# Patient Record
Sex: Female | Born: 1960 | Race: White | Hispanic: No | Marital: Married | Smoking: Never smoker
Health system: Southern US, Community
[De-identification: ages and names within clinical notes are randomized; demographics above are authoritative.]

## PROBLEM LIST (undated history)

## (undated) DIAGNOSIS — K635 Polyp of colon: Secondary | ICD-10-CM

## (undated) DIAGNOSIS — K219 Gastro-esophageal reflux disease without esophagitis: Secondary | ICD-10-CM

## (undated) DIAGNOSIS — Z8619 Personal history of other infectious and parasitic diseases: Secondary | ICD-10-CM

## (undated) DIAGNOSIS — D649 Anemia, unspecified: Secondary | ICD-10-CM

## (undated) DIAGNOSIS — R109 Unspecified abdominal pain: Secondary | ICD-10-CM

## (undated) HISTORY — PX: CATARACT EXTRACTION: SUR2

## (undated) HISTORY — DX: Gastro-esophageal reflux disease without esophagitis: K21.9

## (undated) HISTORY — DX: Personal history of other infectious and parasitic diseases: Z86.19

## (undated) HISTORY — DX: Polyp of colon: K63.5

## (undated) HISTORY — DX: Anemia, unspecified: D64.9

---

## 2004-04-01 HISTORY — PX: ABDOMINAL HYSTERECTOMY: SHX81

## 2004-04-01 LAB — HM PAP SMEAR: HM Pap smear: NORMAL

## 2005-01-03 ENCOUNTER — Encounter (INDEPENDENT_AMBULATORY_CARE_PROVIDER_SITE_OTHER): Payer: Self-pay | Admitting: Specialist

## 2005-01-03 ENCOUNTER — Observation Stay (HOSPITAL_COMMUNITY): Admission: RE | Admit: 2005-01-03 | Discharge: 2005-01-04 | Payer: Self-pay | Admitting: *Deleted

## 2008-10-11 ENCOUNTER — Encounter: Admission: RE | Admit: 2008-10-11 | Discharge: 2008-10-11 | Payer: Self-pay | Admitting: Obstetrics and Gynecology

## 2010-08-17 NOTE — Op Note (Signed)
Helen Harrison, GUARINO NO.:  [id number]   MEDICAL RECORD NO.:  [id number]          PATIENT TYPE:  OBV   LOCATION:  9399                          FACILITY:  [facility]   PHYSICIAN:  Dunlap B. Earlene Plater, M.D.  DATE OF BIRTH:  07/31/1960   DATE OF PROCEDURE:  01/03/2005  DATE OF DISCHARGE:                                 OPERATIVE REPORT   PREOPERATIVE DIAGNOSIS:  Abnormal bleeding, uterine fibroids, left  hydrosalpinx.   POSTOPERATIVE DIAGNOSIS:  Abnormal bleeding, uterine fibroids, left  hydrosalpinx.  Endometriosis.   PROCEDURE:  LAVH, left salpingectomy.   SURGEON:  Chester Holstein. Earlene Plater, M.D.   ASSISTANT:  Cordelia Pen A. Rosalio Macadamia, M.D.   ANESTHESIA:  General.   SPECIMENS:  Uterus, cervix, and left tube.   ESTIMATED BLOOD LOSS:  300 mL.   COMPLICATIONS:  None.   FINDINGS:  Fibroid uterus, normal appearing ovaries, but superficial minimal  endometriosis involving both ovaries.  Left hydrosalpinx apparently due to  adhesions from endometriosis.  Tube was adherent to the ovary and sigmoid  colon.  Also scattered endometriosis implants of the bladder flap and  appendix.   INDICATIONS FOR PROCEDURE:  The patient with a history of heavy and  irregular menstrual bleeding without any history of pelvic pain.  Has  complained of about 3 months of dysmenorrhea.  She was noted to have  fibroids on ultrasound, one of which was submucosal on saline infusion,  however, intramural fibroids were also noted as was a left hydrosalpinx.  The patient was advised of the risks of surgery including infection,  bleeding, damage to bowel, bladder, and surrounding organs.   DESCRIPTION OF PROCEDURE:  The patient was taken to the operating room and  after general anesthesia was obtained, she was placed in the Alan stirrups  and prepped and draped in the usual sterile fashion.  Foley catheter was  inserted into the bladder.  Sponge stick was placed in the vagina.   A 10 mm incision was placed in  the umbilicus and carried sharply to the  fascia.  The fascia was divided sharply and elevated with Kocher clamps.  Posterior sheath and peritoneum were elevated and entered sharply.  Pursestring suture of 0 Vicryl was placed around the fascial defect.  Hasson  cannula inserted and secured. Pneumoperitoneum obtained with CO2 gas.  Trendelenburg position obtained.  11 mm XL trocar placed in the left lower  quadrant and 5 mm in the right lower quadrant each under direct laparoscopic  visualization.  Course of each ureter identified and found to be well away  from the area of interest.  Left tube was identified.  Adhesions to the  sigmoid colon were taken down sharply.  Adhesions to the ovary were taken  down with the Harmonic scalpel.  Dissection was continued up to the uterine  cornua and the tube was transected, removed, and submitted to pathology.   The left round ligament was then identified, sealed, and divided with the  Harmonic.  Bladder flap developed with the Harmonic.  Anterior and posterior  peritoneum developed with the Harmonic.  Uterine artery  exposed.  It was  grasped with the Harmonic and divided at the level of the isthmus on the  uterus.  The procedure was repeated on the right side in the exact same  fashion.  The bladder was mobilized bluntly.  Sponge stick placed into the  anterior fornix and anterior colpotomy made with the Harmonic scalpel.   Gas was released and attention turned to the vagina.  Jacobs tenaculum  placed on the anterior and posterior lip of the cervix.  Posterior colpotomy  made sharply.  Uterosacral ligaments grasped with curved Heaney clamp,  divided, and suture ligated with 0 Vicryl with hemostasis obtained.  The  remainder of the cardinal ligaments were clamped with Heaney clamp, divided  sharply, and sutured with 0 Vicryl with hemostasis obtained.  The uterus and  cervix were delivered as an intact specimen.   There was a bleeder on the left  pelvic side wall next to the uterosacral  ligament which was made hemostatic with a superficial simple stitch of 0  Vicryl.   Posterior culdoplasty performed by entering the vagina posteriorly, taking a  bite of the left uterosacral ligament, reefing along the posterior  peritoneum, taking a bite of the right uterosacral ligament, and exiting  posteriorly in the vagina.  The vaginal cuff was then closed with  interrupted figure-of-eight sutures of 0 Vicryl.  The culdoplasty suture was  then tied down.  This obliterated the posterior cul-de-sac and provided good  support to the vaginal cuff.   Pneumoperitoneum reobtained and the pelvis inspected.  There was one  superficial bleeder on the uterosacral ligament stump, made hemostatic with  the bipolar cautery.  The remainder of the lines of dissection were  hemostatic.  The majority of the bladder flap endometriosis was excised with  the specimen.  The residual endometriosis was minimal.   The inferior ports were removed and their sites inspected laparoscopically  and found to be hemostatic.  Scope removed.  Gas released.  Hasson cannula  removed.  Inserted index finger through the fascial defect, snugged down the  pursestring suture.  This obliterated the fascial defect and no intra-  abdominal contents herniated through prior to closure.  Skin was closed at  the umbilicus with 4-0 Vicryl.  Skin was closed at the inferior ports with  Dermabond.   The patient tolerated the procedure well and there were no complications.  She was taken to the recovery room awake, alert, and in stable condition.  Needle, sponge, and instrument counts correct per the operating room staff.      Gerri Spore B. Earlene Plater, M.D.  Electronically Signed     WBD/MEDQ  D:  01/03/2005  T:  01/03/2005  Job:  [id number]

## 2011-01-07 DIAGNOSIS — R12 Heartburn: Secondary | ICD-10-CM | POA: Insufficient documentation

## 2011-01-07 DIAGNOSIS — L709 Acne, unspecified: Secondary | ICD-10-CM | POA: Insufficient documentation

## 2011-01-28 DIAGNOSIS — A048 Other specified bacterial intestinal infections: Secondary | ICD-10-CM | POA: Insufficient documentation

## 2011-01-28 DIAGNOSIS — K219 Gastro-esophageal reflux disease without esophagitis: Secondary | ICD-10-CM | POA: Insufficient documentation

## 2013-04-01 LAB — HM COLONOSCOPY: HM COLON: NORMAL

## 2013-09-29 LAB — HM MAMMOGRAPHY: HM MAMMO: NORMAL

## 2014-03-01 ENCOUNTER — Ambulatory Visit: Payer: Self-pay | Admitting: Ophthalmology

## 2014-03-15 ENCOUNTER — Ambulatory Visit: Payer: Self-pay | Admitting: Ophthalmology

## 2014-05-31 LAB — HM DEXA SCAN: HM DEXA SCAN: NORMAL

## 2014-07-23 NOTE — Op Note (Signed)
PATIENT NAME:  Helen Harrison, [name] MR#:  [id number] DATE OF BIRTH:  Mar 04, 1961  DATE OF PROCEDURE:  03/15/2014  PREOPERATIVE DIAGNOSIS:  Nuclear sclerotic cataract of the right eye.   POSTOPERATIVE DIAGNOSIS:  Nuclear sclerotic cataract of the right eye.   OPERATIVE PROCEDURE:  Cataract extraction by phacoemulsification with implant of intraocular lens to right eye.   SURGEON:  Galen ManilaWilliam Chelci Wintermute, MD.   ANESTHESIA:  1. Managed anesthesia care.  2. Topical tetracaine drops followed by 2% Xylocaine jelly applied in the preoperative holding area.   COMPLICATIONS:  None.   TECHNIQUE:   Stop and chop.  DESCRIPTION OF PROCEDURE:  The patient was examined and consented in the preoperative holding area where the aforementioned topical anesthesia was applied to the right eye and then brought back to the Operating Room where the right eye was prepped and draped in the usual sterile ophthalmic fashion and a lid speculum was placed. A paracentesis was created with the side port blade and the anterior chamber was filled with viscoelastic. A near clear corneal incision was performed with the steel keratome. A continuous curvilinear capsulorrhexis was performed with a cystotome followed by the capsulorrhexis forceps. Hydrodissection and hydrodelineation were carried out with BSS on a blunt cannula. The lens was removed in a stop and chop technique and the remaining cortical material was removed with the irrigation-aspiration handpiece. The capsular bag was inflated with viscoelastic and the Tecnis ZCB00 18.0 -diopter lens, serial number [id number] was placed in the capsular bag without complication. The remaining viscoelastic was removed from the eye with the irrigation-aspiration handpiece. The wounds were hydrated. The anterior chamber was flushed with Miostat and the eye was inflated to physiologic pressure. Due to a penicillin allergy, cefuroxime was not placed in the eye; instead a 3:1 dilution of Vigamox was  placed in the anterior chamber. The wounds were found to be water tight. The eye was dressed with Vigamox. The patient was given protective glasses to wear throughout the day and a shield with which to sleep tonight. The patient was also given drops with which to begin a drop regimen today and will follow-up with me in one day.    ____________________________ Jerilee FieldWilliam L. Casady Voshell, MD wlp:jp D: 03/15/2014 21:57:13 ET T: 03/16/2014 08:09:32 ET JOB#: [id number]  cc: Kaesha Kirsch L. Octa Uplinger, MD, <Dictator> Jerilee FieldWILLIAM L Darlinda Bellows MD ELECTRONICALLY SIGNED 03/16/2014 13:56

## 2014-07-23 NOTE — Op Note (Signed)
PATIENT NAME:  Helen Harrison, Aaisha A MR#:  [id number] DATE OF BIRTH:  April 29, 1960  DATE OF PROCEDURE:  03/01/2014  PREOPERATIVE DIAGNOSIS:  Nuclear sclerotic cataract of the left eye.   POSTOPERATIVE DIAGNOSIS:  Nuclear sclerotic cataract of the left eye.   OPERATIVE PROCEDURE:  Cataract extraction by phacoemulsification with implant of intraocular lens to left eye.   SURGEON:  Galen ManilaWilliam Jewelia Bocchino, MD.   ANESTHESIA:  1. Managed anesthesia care.  2. Topical tetracaine drops followed by 2% Xylocaine jelly applied in the preoperative holding area.   COMPLICATIONS:  None.   TECHNIQUE:   Stop and chop.   DESCRIPTION OF PROCEDURE:  The patient was examined and consented in the preoperative holding area where the aforementioned topical anesthesia was applied to the left eye and then brought back to the Operating Room where the left eye was prepped and draped in the usual sterile ophthalmic fashion and a lid speculum was placed. A paracentesis was created with the side port blade and the anterior chamber was filled with viscoelastic. A near clear corneal incision was performed with the steel keratome. A continuous curvilinear capsulorrhexis was performed with a cystotome followed by the capsulorrhexis forceps. Hydrodissection and hydrodelineation were carried out with BSS on a blunt cannula. The lens was removed in a stop and chop technique and the remaining cortical material was removed with the irrigation-aspiration handpiece. The capsular bag was inflated with viscoelastic and the Tecnis ZCB00, 22.0-diopter lens, serial number [id number] was placed in the capsular bag without complication. The remaining viscoelastic was removed from the eye with the irrigation-aspiration handpiece. The wounds were hydrated. The anterior chamber was flushed with Miostat and the eye was inflated to physiologic pressure.   Please note that CEFUROXIME was not placed within the eye due to a PENICILLIN ALLERGY, rather a 3:1 dilution of  Vigamox was placed in the anterior chamber. The wounds were found to be water tight. The eye was dressed with Vigamox. The patient was given protective glasses to wear throughout the day and a shield with which to sleep tonight. The patient was also given drops with which to begin a drop regimen today and will follow-up with me in one day.      ____________________________ Jerilee FieldWilliam L. Elanor Cale, MD wlp:DT D: 03/01/2014 16:17:21 ET T: 03/01/2014 17:32:59 ET JOB#: [id number]  cc: Saige Canton L. Jayde Mcallister, MD, <Dictator> Jerilee FieldWILLIAM L Sanita Estrada MD ELECTRONICALLY SIGNED 03/02/2014 12:16

## 2014-09-06 ENCOUNTER — Telehealth: Payer: Self-pay | Admitting: Family Medicine

## 2014-09-06 ENCOUNTER — Other Ambulatory Visit: Payer: Self-pay | Admitting: Family Medicine

## 2014-09-06 DIAGNOSIS — K2100 Gastro-esophageal reflux disease with esophagitis, without bleeding: Secondary | ICD-10-CM

## 2014-09-06 DIAGNOSIS — R768 Other specified abnormal immunological findings in serum: Secondary | ICD-10-CM

## 2014-09-06 NOTE — Telephone Encounter (Signed)
Reviewed lab results from [facility]. Vitamin B12 was normal. Vitamin D is low. Resume 2000IU vitamin D3 OTC. Will recheck at her physical in August. Also discussed moving up physical as it is closer to provider due date. She will reschedule for earlier to avoid conflicting with maternity leave.

## 2014-10-10 ENCOUNTER — Ambulatory Visit: Payer: Self-pay | Admitting: Gastroenterology

## 2014-11-02 ENCOUNTER — Ambulatory Visit (INDEPENDENT_AMBULATORY_CARE_PROVIDER_SITE_OTHER): Payer: BLUE CROSS/BLUE SHIELD | Admitting: Gastroenterology

## 2014-11-02 ENCOUNTER — Encounter: Payer: Self-pay | Admitting: Gastroenterology

## 2014-11-02 VITALS — BP 131/82 | HR 90 | Temp 98.6°F | Ht 64.0 in | Wt 160.0 lb

## 2014-11-02 DIAGNOSIS — Q400 Congenital hypertrophic pyloric stenosis: Secondary | ICD-10-CM

## 2014-11-02 MED ORDER — BISMUTH SUBSALICYLATE 262 MG/15ML PO SUSP: 30.0000 mL | Freq: Three times a day (TID) | ORAL | Status: DC

## 2014-11-02 MED ORDER — RANITIDINE HCL 150 MG PO CAPS: 150.0000 mg | ORAL_CAPSULE | Freq: Four times a day (QID) | ORAL | Status: DC

## 2014-11-02 MED ORDER — METRONIDAZOLE 250 MG PO TABS: 250.0000 mg | ORAL_TABLET | Freq: Four times a day (QID) | ORAL | Status: DC

## 2014-11-02 MED ORDER — TETRACYCLINE HCL 500 MG PO CAPS: 500.0000 mg | ORAL_CAPSULE | Freq: Four times a day (QID) | ORAL | Status: DC

## 2014-11-02 NOTE — Progress Notes (Signed)
Gastroenterology Consultation  Referring Provider:     Loura Pardon, NP Primary Care Physician:  Filbert Berthold, NP Primary Gastroenterologist:  Dr. Servando Snare     Reason for Consultation:     H. pylori        HPI:   Helen Harrison is a 54 y.o. y/o female referred for consultation & management of H. pylori infection by Dr. Filbert Berthold, NP.  Patient comes today with a history of H. pylori infection. The patient states she has been on multiple medications for her H. pylori. The patient's treatments have been limited by her allergy to amoxicillin. The patient states that she got a terrible migraine headache when she took amoxicillin. There is no report of any unexplained weight loss and the patient actually states she is gaining weight. There is no report of any GI symptoms except that she has some heartburn which she reports has got better when she started taking probiotics. The patient was follow-up in [facility] for her H. pylori in the past revealed she states that after having the treatment every time she did the breath test came back positive. He also states that she's been treated multiple times with clarithromycin for her H. pylori.  Past Medical History  Diagnosis Date  . Acid reflux   . History of Helicobacter pylori infection     Past Surgical History  Procedure Laterality Date  . Abdominal hysterectomy      Prior to Admission medications   Medication Sig Start Date End Date Taking? Authorizing Provider  cholecalciferol (VITAMIN D) 1000 UNITS tablet Take 2,000 Units by mouth daily.   Yes Historical Provider, MD  Fish Oil-Cholecalciferol (FISH OIL + D3) 1000-1000 MG-UNIT CAPS Take 1,000 mg by mouth.   Yes Historical Provider, MD  Probiotic Product (PROBIOTIC DAILY PO) Take by mouth.   Yes Historical Provider, MD    Family History  Problem Relation Age of Onset  . Ovarian cancer Sister      History  Substance Use Topics  . Smoking status: Never Smoker   .  Smokeless tobacco: Never Used  . Alcohol Use: 0.0 oz/week    0 Standard drinks or equivalent per week     Comment: occasional    Allergies as of 11/02/2014 - Review Complete 11/02/2014  Allergen Reaction Noted  . Amoxicillin Hives 11/02/2014    Review of Systems:    All systems reviewed and negative except where noted in HPI.   Physical Exam:  BP 131/82 mmHg  Pulse 90  Temp(Src) 98.6 F (37 C) (Oral)  Ht  (1.626 m)  Wt 160 lb (72.576 kg)  BMI 27.45 kg/m2 No LMP recorded. Psych:  Alert and cooperative. Normal mood and affect. General:   Alert,  Well-developed, well-nourished, pleasant and cooperative in NAD Head:  Normocephalic and atraumatic. Eyes:  Sclera clear, no icterus.   Conjunctiva pink. Ears:  Normal auditory acuity. Nose:  No deformity, discharge, or lesions. Mouth:  No deformity or lesions,oropharynx pink & moist. Neck:  Supple; no masses or thyromegaly. Lungs:  Respirations even and unlabored.  Clear throughout to auscultation.   No wheezes, crackles, or rhonchi. No acute distress. Heart:  Regular rate and rhythm; no murmurs, clicks, rubs, or gallops. Abdomen:  Normal bowel sounds.  No bruits.  Soft, non-tender and non-distended without masses, hepatosplenomegaly or hernias noted.  No guarding or rebound tenderness.  Negative Carnett sign.   Rectal:  Deferred.  Msk:  Symmetrical without gross deformities.  Good, equal movement & strength bilaterally. Pulses:  Normal pulses noted. Extremities:  No clubbing or edema.  No cyanosis. Neurologic:  Alert and oriented x3;  grossly normal neurologically. Skin:  Intact without significant lesions or rashes.  No jaundice. Lymph Nodes:  No significant cervical adenopathy. Psych:  Alert and cooperative. Normal mood and affect.  Imaging Studies: No results found.  Assessment and Plan:   Helen Harrison is a 54 y.o. y/o female comes in today with a history of H. pylori that has been resistant to treatment. The patient  states she has been on triple therapy which has included amoxicillin and clarithromycin in the past. The patient is likely resistant to clarithromycin and has an allergy with severe migraines when she took amoxicillin. The patient will be tried on quadruple therapy including bismuth and H2 blocker tetracycline and Flagyl. He checked 6 weeks after treatment with a stool antigen test to see if she has eradicated the H. pylori. If she does not she has been explained that although the risk of ulcers and and cancer are there is a very low risk and she may have to live with H. pylori if she cannot eradicated. The patient has been explained the plan and agrees with it.   Note: This dictation was prepared with Dragon dictation along with smaller phrase technology. Any transcriptional errors that result from this process are unintentional.

## 2014-11-14 ENCOUNTER — Encounter: Payer: Self-pay | Admitting: Family Medicine

## 2014-11-14 ENCOUNTER — Ambulatory Visit (INDEPENDENT_AMBULATORY_CARE_PROVIDER_SITE_OTHER): Payer: BLUE CROSS/BLUE SHIELD | Admitting: Family Medicine

## 2014-11-14 VITALS — BP 133/84 | HR 102 | Temp 98.1°F | Resp 16 | Ht 64.0 in | Wt 160.2 lb

## 2014-11-14 DIAGNOSIS — M545 Low back pain, unspecified: Secondary | ICD-10-CM | POA: Insufficient documentation

## 2014-11-14 DIAGNOSIS — K635 Polyp of colon: Secondary | ICD-10-CM | POA: Insufficient documentation

## 2014-11-14 DIAGNOSIS — Z01419 Encounter for gynecological examination (general) (routine) without abnormal findings: Secondary | ICD-10-CM

## 2014-11-14 DIAGNOSIS — N951 Menopausal and female climacteric states: Secondary | ICD-10-CM | POA: Insufficient documentation

## 2014-11-14 DIAGNOSIS — R002 Palpitations: Secondary | ICD-10-CM | POA: Insufficient documentation

## 2014-11-14 DIAGNOSIS — R635 Abnormal weight gain: Secondary | ICD-10-CM | POA: Insufficient documentation

## 2014-11-14 DIAGNOSIS — E559 Vitamin D deficiency, unspecified: Secondary | ICD-10-CM | POA: Insufficient documentation

## 2014-11-14 DIAGNOSIS — K219 Gastro-esophageal reflux disease without esophagitis: Secondary | ICD-10-CM | POA: Insufficient documentation

## 2014-11-14 DIAGNOSIS — D649 Anemia, unspecified: Secondary | ICD-10-CM | POA: Insufficient documentation

## 2014-11-14 NOTE — Patient Instructions (Signed)
Health Maintenance Adopting a healthy lifestyle and getting preventive care can go a long way to promote health and wellness. Talk with your health care provider about what schedule of regular examinations is right for you. This is a good chance for you to check in with your provider about disease prevention and staying healthy. In between checkups, there are plenty of things you can do on your own. Experts have done a lot of research about which lifestyle changes and preventive measures are most likely to keep you healthy. Ask your health care provider for more information. WEIGHT AND DIET  Eat a healthy diet  Be sure to include plenty of vegetables, fruits, low-fat dairy products, and lean protein.  Do not eat a lot of foods high in solid fats, added sugars, or salt.  Get regular exercise. This is one of the most important things you can do for your health.  Most adults should exercise for at least 150 minutes each week. The exercise should increase your heart rate and make you sweat (moderate-intensity exercise).  Most adults should also do strengthening exercises at least twice a week. This is in addition to the moderate-intensity exercise.  Maintain a healthy weight  Body mass index (BMI) is a measurement that can be used to identify possible weight problems. It estimates body fat based on height and weight. Your health care provider can help determine your BMI and help you achieve or maintain a healthy weight.  For females 25 years of age and older:   A BMI below 18.5 is considered underweight.  A BMI of 18.5 to 24.9 is normal.  A BMI of 25 to 29.9 is considered overweight.  A BMI of 30 and above is considered obese.  Watch levels of cholesterol and blood lipids  You should start having your blood tested for lipids and cholesterol at 56 years of age, then have this test every 5 years.  You may need to have your cholesterol levels checked more often if:  Your lipid or  cholesterol levels are high.  You are older than 56 years of age.  You are at high risk for heart disease.  CANCER SCREENING   Lung Cancer  Lung cancer screening is recommended for adults 97-92 years old who are at high risk for lung cancer because of a history of smoking.  A yearly low-dose CT scan of the lungs is recommended for people who:  Currently smoke.  Have quit within the past 15 years.  Have at least a 30-pack-year history of smoking. A pack year is smoking an average of one pack of cigarettes a day for 1 year.  Yearly screening should continue until it has been 15 years since you quit.  Yearly screening should stop if you develop a health problem that would prevent you from having lung cancer treatment.  Breast Cancer  Practice breast self-awareness. This means understanding how your breasts normally appear and feel.  It also means doing regular breast self-exams. Let your health care provider know about any changes, no matter how small.  If you are in your 20s or 30s, you should have a clinical breast exam (CBE) by a health care provider every 1-3 years as part of a regular health exam.  If you are 76 or older, have a CBE every year. Also consider having a breast X-ray (mammogram) every year.  If you have a family history of breast cancer, talk to your health care provider about genetic screening.  If you are  at high risk for breast cancer, talk to your health care provider about having an MRI and a mammogram every year.  Breast cancer gene (BRCA) assessment is recommended for women who have family members with BRCA-related cancers. BRCA-related cancers include:  Breast.  Ovarian.  Tubal.  Peritoneal cancers.  Results of the assessment will determine the need for genetic counseling and BRCA1 and BRCA2 testing. Cervical Cancer Routine pelvic examinations to screen for cervical cancer are no longer recommended for nonpregnant women who are considered low  risk for cancer of the pelvic organs (ovaries, uterus, and vagina) and who do not have symptoms. A pelvic examination may be necessary if you have symptoms including those associated with pelvic infections. Ask your health care provider if a screening pelvic exam is right for you.   The Pap test is the screening test for cervical cancer for women who are considered at risk.  If you had a hysterectomy for a problem that was not cancer or a condition that could lead to cancer, then you no longer need Pap tests.  If you are older than 65 years, and you have had normal Pap tests for the past 10 years, you no longer need to have Pap tests.  If you have had past treatment for cervical cancer or a condition that could lead to cancer, you need Pap tests and screening for cancer for at least 20 years after your treatment.  If you no longer get a Pap test, assess your risk factors if they change (such as having a new sexual partner). This can affect whether you should start being screened again.  Some women have medical problems that increase their chance of getting cervical cancer. If this is the case for you, your health care provider may recommend more frequent screening and Pap tests.  The human papillomavirus (HPV) test is another test that may be used for cervical cancer screening. The HPV test looks for the virus that can cause cell changes in the cervix. The cells collected during the Pap test can be tested for HPV.  The HPV test can be used to screen women 70 years of age and older. Getting tested for HPV can extend the interval between normal Pap tests from three to five years.  An HPV test also should be used to screen women of any age who have unclear Pap test results.  After 56 years of age, women should have HPV testing as often as Pap tests.  Colorectal Cancer  This type of cancer can be detected and often prevented.  Routine colorectal cancer screening usually begins at 56 years of  age and continues through 56 years of age.  Your health care provider may recommend screening at an earlier age if you have risk factors for colon cancer.  Your health care provider may also recommend using home test kits to check for hidden blood in the stool.  A small camera at the end of a tube can be used to examine your colon directly (sigmoidoscopy or colonoscopy). This is done to check for the earliest forms of colorectal cancer.  Routine screening usually begins at age 49.  Direct examination of the colon should be repeated every 5-10 years through 56 years of age. However, you may need to be screened more often if early forms of precancerous polyps or small growths are found. Skin Cancer  Check your skin from head to toe regularly.  Tell your health care provider about any new moles or changes in  moles, especially if there is a change in a mole's shape or color.  Also tell your health care provider if you have a mole that is larger than the size of a pencil eraser.  Always use sunscreen. Apply sunscreen liberally and repeatedly throughout the day.  Protect yourself by wearing long sleeves, pants, a wide-brimmed hat, and sunglasses whenever you are outside. HEART DISEASE, DIABETES, AND HIGH BLOOD PRESSURE   Have your blood pressure checked at least every 1-2 years. High blood pressure causes heart disease and increases the risk of stroke.  If you are between 80 years and 55 years old, ask your health care provider if you should take aspirin to prevent strokes.  Have regular diabetes screenings. This involves taking a blood sample to check your fasting blood sugar level.  If you are at a normal weight and have a low risk for diabetes, have this test once every three years after 56 years of age.  If you are overweight and have a high risk for diabetes, consider being tested at a younger age or more often. PREVENTING INFECTION  Hepatitis B  If you have a higher risk for  hepatitis B, you should be screened for this virus. You are considered at high risk for hepatitis B if:  You were born in a country where hepatitis B is common. Ask your health care provider which countries are considered high risk.  Your parents were born in a high-risk country, and you have not been immunized against hepatitis B (hepatitis B vaccine).  You have HIV or AIDS.  You use needles to inject street drugs.  You live with someone who has hepatitis B.  You have had sex with someone who has hepatitis B.  You get hemodialysis treatment.  You take certain medicines for conditions, including cancer, organ transplantation, and autoimmune conditions. Hepatitis C  Blood testing is recommended for:  Everyone born from [birth year] through 1967.  Anyone with known risk factors for hepatitis C. Sexually transmitted infections (STIs)  You should be screened for sexually transmitted infections (STIs) including gonorrhea and chlamydia if:  You are sexually active and are younger than 56 years of age.  You are older than 56 years of age and your health care provider tells you that you are at risk for this type of infection.  Your sexual activity has changed since you were last screened and you are at an increased risk for chlamydia or gonorrhea. Ask your health care provider if you are at risk.  If you do not have HIV, but are at risk, it may be recommended that you take a prescription medicine daily to prevent HIV infection. This is called pre-exposure prophylaxis (PrEP). You are considered at risk if:  You are sexually active and do not regularly use condoms or know the HIV status of your partner(s).  You take drugs by injection.  You are sexually active with a partner who has HIV. Talk with your health care provider about whether you are at high risk of being infected with HIV. If you choose to begin PrEP, you should first be tested for HIV. You should then be tested every 3 months for  as long as you are taking PrEP.  PREGNANCY   If you are premenopausal and you may become pregnant, ask your health care provider about preconception counseling.  If you may become pregnant, take 400 to 800 micrograms (mcg) of folic acid every day.  If you want to prevent pregnancy, talk to your  health care provider about birth control (contraception). OSTEOPOROSIS AND MENOPAUSE   Osteoporosis is a disease in which the bones lose minerals and strength with aging. This can result in serious bone fractures. Your risk for osteoporosis can be identified using a bone density scan.  If you are 77 years of age or older, or if you are at risk for osteoporosis and fractures, ask your health care provider if you should be screened.  Ask your health care provider whether you should take a calcium or vitamin D supplement to lower your risk for osteoporosis.  Menopause may have certain physical symptoms and risks.  Hormone replacement therapy may reduce some of these symptoms and risks. Talk to your health care provider about whether hormone replacement therapy is right for you.  HOME CARE INSTRUCTIONS   Schedule regular health, dental, and eye exams.  Stay current with your immunizations.   Do not use any tobacco products including cigarettes, chewing tobacco, or electronic cigarettes.  If you are pregnant, do not drink alcohol.  If you are breastfeeding, limit how much and how often you drink alcohol.  Limit alcohol intake to no more than 1 drink per day for nonpregnant women. One drink equals 12 ounces of beer, 5 ounces of wine, or 1 ounces of hard liquor.  Do not use street drugs.  Do not share needles.  Ask your health care provider for help if you need support or information about quitting drugs.  Tell your health care provider if you often feel depressed.  Tell your health care provider if you have ever been abused or do not feel safe at home. Document Released: 10/01/2010  Document Revised: 08/02/2013 Document Reviewed: 02/17/2013 [facility] Patient Information 2015 [place], [place]. This information is not intended to replace advice given to you by your health care provider. Make sure you discuss any questions you have with your health care provider. Health Maintenance Adopting a healthy lifestyle and getting preventive care can go a long way to promote health and wellness. Talk with your health care provider about what schedule of regular examinations is right for you. This is a good chance for you to check in with your provider about disease prevention and staying healthy. In between checkups, there are plenty of things you can do on your own. Experts have done a lot of research about which lifestyle changes and preventive measures are most likely to keep you healthy. Ask your health care provider for more information. WEIGHT AND DIET  Eat a healthy diet  Be sure to include plenty of vegetables, fruits, low-fat dairy products, and lean protein.  Do not eat a lot of foods high in solid fats, added sugars, or salt.  Get regular exercise. This is one of the most important things you can do for your health.  Most adults should exercise for at least 150 minutes each week. The exercise should increase your heart rate and make you sweat (moderate-intensity exercise).  Most adults should also do strengthening exercises at least twice a week. This is in addition to the moderate-intensity exercise.  Maintain a healthy weight  Body mass index (BMI) is a measurement that can be used to identify possible weight problems. It estimates body fat based on height and weight. Your health care provider can help determine your BMI and help you achieve or maintain a healthy weight.  For females 3 years of age and older:   A BMI below 18.5 is considered underweight.  A BMI of 18.5 to 24.9  is normal.  A BMI of 25 to 29.9 is considered overweight.  A BMI of 30 and above is  considered obese.  Watch levels of cholesterol and blood lipids  You should start having your blood tested for lipids and cholesterol at 56 years of age, then have this test every 5 years.  You may need to have your cholesterol levels checked more often if:  Your lipid or cholesterol levels are high.  You are older than 56 years of age.  You are at high risk for heart disease.  CANCER SCREENING   Lung Cancer  Lung cancer screening is recommended for adults 25-32 years old who are at high risk for lung cancer because of a history of smoking.  A yearly low-dose CT scan of the lungs is recommended for people who:  Currently smoke.  Have quit within the past 15 years.  Have at least a 30-pack-year history of smoking. A pack year is smoking an average of one pack of cigarettes a day for 1 year.  Yearly screening should continue until it has been 15 years since you quit.  Yearly screening should stop if you develop a health problem that would prevent you from having lung cancer treatment.  Breast Cancer  Practice breast self-awareness. This means understanding how your breasts normally appear and feel.  It also means doing regular breast self-exams. Let your health care provider know about any changes, no matter how small.  If you are in your 20s or 30s, you should have a clinical breast exam (CBE) by a health care provider every 1-3 years as part of a regular health exam.  If you are 101 or older, have a CBE every year. Also consider having a breast X-ray (mammogram) every year.  If you have a family history of breast cancer, talk to your health care provider about genetic screening.  If you are at high risk for breast cancer, talk to your health care provider about having an MRI and a mammogram every year.  Breast cancer gene (BRCA) assessment is recommended for women who have family members with BRCA-related cancers. BRCA-related cancers  include:  Breast.  Ovarian.  Tubal.  Peritoneal cancers.  Results of the assessment will determine the need for genetic counseling and BRCA1 and BRCA2 testing. Cervical Cancer Routine pelvic examinations to screen for cervical cancer are no longer recommended for nonpregnant women who are considered low risk for cancer of the pelvic organs (ovaries, uterus, and vagina) and who do not have symptoms. A pelvic examination may be necessary if you have symptoms including those associated with pelvic infections. Ask your health care provider if a screening pelvic exam is right for you.   The Pap test is the screening test for cervical cancer for women who are considered at risk.  If you had a hysterectomy for a problem that was not cancer or a condition that could lead to cancer, then you no longer need Pap tests.  If you are older than 65 years, and you have had normal Pap tests for the past 10 years, you no longer need to have Pap tests.  If you have had past treatment for cervical cancer or a condition that could lead to cancer, you need Pap tests and screening for cancer for at least 20 years after your treatment.  If you no longer get a Pap test, assess your risk factors if they change (such as having a new sexual partner). This can affect whether you should start  being screened again.  Some women have medical problems that increase their chance of getting cervical cancer. If this is the case for you, your health care provider may recommend more frequent screening and Pap tests.  The human papillomavirus (HPV) test is another test that may be used for cervical cancer screening. The HPV test looks for the virus that can cause cell changes in the cervix. The cells collected during the Pap test can be tested for HPV.  The HPV test can be used to screen women 32 years of age and older. Getting tested for HPV can extend the interval between normal Pap tests from three to five years.  An HPV  test also should be used to screen women of any age who have unclear Pap test results.  After 56 years of age, women should have HPV testing as often as Pap tests.  Colorectal Cancer  This type of cancer can be detected and often prevented.  Routine colorectal cancer screening usually begins at 56 years of age and continues through 56 years of age.  Your health care provider may recommend screening at an earlier age if you have risk factors for colon cancer.  Your health care provider may also recommend using home test kits to check for hidden blood in the stool.  A small camera at the end of a tube can be used to examine your colon directly (sigmoidoscopy or colonoscopy). This is done to check for the earliest forms of colorectal cancer.  Routine screening usually begins at age 6.  Direct examination of the colon should be repeated every 5-10 years through 56 years of age. However, you may need to be screened more often if early forms of precancerous polyps or small growths are found. Skin Cancer  Check your skin from head to toe regularly.  Tell your health care provider about any new moles or changes in moles, especially if there is a change in a mole's shape or color.  Also tell your health care provider if you have a mole that is larger than the size of a pencil eraser.  Always use sunscreen. Apply sunscreen liberally and repeatedly throughout the day.  Protect yourself by wearing long sleeves, pants, a wide-brimmed hat, and sunglasses whenever you are outside. HEART DISEASE, DIABETES, AND HIGH BLOOD PRESSURE   Have your blood pressure checked at least every 1-2 years. High blood pressure causes heart disease and increases the risk of stroke.  If you are between 68 years and 81 years old, ask your health care provider if you should take aspirin to prevent strokes.  Have regular diabetes screenings. This involves taking a blood sample to check your fasting blood sugar  level.  If you are at a normal weight and have a low risk for diabetes, have this test once every three years after 56 years of age.  If you are overweight and have a high risk for diabetes, consider being tested at a younger age or more often. PREVENTING INFECTION  Hepatitis B  If you have a higher risk for hepatitis B, you should be screened for this virus. You are considered at high risk for hepatitis B if:  You were born in a country where hepatitis B is common. Ask your health care provider which countries are considered high risk.  Your parents were born in a high-risk country, and you have not been immunized against hepatitis B (hepatitis B vaccine).  You have HIV or AIDS.  You use needles to inject  street drugs.  You live with someone who has hepatitis B.  You have had sex with someone who has hepatitis B.  You get hemodialysis treatment.  You take certain medicines for conditions, including cancer, organ transplantation, and autoimmune conditions. Hepatitis C  Blood testing is recommended for:  Everyone born from [birth year] through 1967.  Anyone with known risk factors for hepatitis C. Sexually transmitted infections (STIs)  You should be screened for sexually transmitted infections (STIs) including gonorrhea and chlamydia if:  You are sexually active and are younger than 56 years of age.  You are older than 56 years of age and your health care provider tells you that you are at risk for this type of infection.  Your sexual activity has changed since you were last screened and you are at an increased risk for chlamydia or gonorrhea. Ask your health care provider if you are at risk.  If you do not have HIV, but are at risk, it may be recommended that you take a prescription medicine daily to prevent HIV infection. This is called pre-exposure prophylaxis (PrEP). You are considered at risk if:  You are sexually active and do not regularly use condoms or know the HIV status  of your partner(s).  You take drugs by injection.  You are sexually active with a partner who has HIV. Talk with your health care provider about whether you are at high risk of being infected with HIV. If you choose to begin PrEP, you should first be tested for HIV. You should then be tested every 3 months for as long as you are taking PrEP.  PREGNANCY   If you are premenopausal and you may become pregnant, ask your health care provider about preconception counseling.  If you may become pregnant, take 400 to 800 micrograms (mcg) of folic acid every day.  If you want to prevent pregnancy, talk to your health care provider about birth control (contraception). OSTEOPOROSIS AND MENOPAUSE   Osteoporosis is a disease in which the bones lose minerals and strength with aging. This can result in serious bone fractures. Your risk for osteoporosis can be identified using a bone density scan.  If you are 31 years of age or older, or if you are at risk for osteoporosis and fractures, ask your health care provider if you should be screened.  Ask your health care provider whether you should take a calcium or vitamin D supplement to lower your risk for osteoporosis.  Menopause may have certain physical symptoms and risks.  Hormone replacement therapy may reduce some of these symptoms and risks. Talk to your health care provider about whether hormone replacement therapy is right for you.  HOME CARE INSTRUCTIONS   Schedule regular health, dental, and eye exams.  Stay current with your immunizations.   Do not use any tobacco products including cigarettes, chewing tobacco, or electronic cigarettes.  If you are pregnant, do not drink alcohol.  If you are breastfeeding, limit how much and how often you drink alcohol.  Limit alcohol intake to no more than 1 drink per day for nonpregnant women. One drink equals 12 ounces of beer, 5 ounces of wine, or 1 ounces of hard liquor.  Do not use street  drugs.  Do not share needles.  Ask your health care provider for help if you need support or information about quitting drugs.  Tell your health care provider if you often feel depressed.  Tell your health care provider if you have ever been abused or  do not feel safe at home. Document Released: 10/01/2010 Document Revised: 08/02/2013 Document Reviewed: 02/17/2013 [facility] Patient Information 2015 [place], [place]. This information is not intended to replace advice given to you by your health care provider. Make sure you discuss any questions you have with your health care provider.

## 2014-11-14 NOTE — Progress Notes (Signed)
Subjective:    Patient ID: Helen Harrison, female    DOB: 08-24-1960, 54 y.o.   MRN: [id number]  HPI: Helen Harrison is a 54 y.o. female presenting on 11/14/2014 for Annual Exam   HPI  Pt presents for well woman exam today. She was recently seen by Dr. Servando Snare and is being treating for H. Pylori. Pt has stopped taking Vitamin D and fish oil since she started due inability to mix with regimen.   Overall doing well. Occasional hot flashes. Exercising. Trying to eat a healthy diet.  Pap- hysterectomy 10 years ago, not needed. Mammogram- 2015- would like to screen every 2 years. Dexa Scan- march 2016- normal. Colonoscopy 2014: Normal.   Past Medical History  Diagnosis Date  . Acid reflux   . History of Helicobacter pylori infection   . Anemia   . Colon polyps    Social History   Social History  . Marital Status: Married    Spouse Name: N/A  . Number of Children: N/A  . Years of Education: N/A   Occupational History  . Not on file.   Social History Main Topics  . Smoking status: Never Smoker   . Smokeless tobacco: Never Used  . Alcohol Use: 0.0 oz/week    0 Standard drinks or equivalent per week     Comment: occasional  . Drug Use: No  . Sexual Activity: Not on file   Other Topics Concern  . Not on file   Social History Narrative   Family History  Problem Relation Age of Onset  . Ovarian cancer Sister   . Alcohol abuse Mother   . Alcohol abuse Father    Current Outpatient Prescriptions on File Prior to Visit  Medication Sig  . bismuth subsalicylate (PEPTO-BISMOL) 262 MG/15ML suspension Take 30 mLs by mouth 4 (four) times daily -  before meals and at bedtime.  . metroNIDAZOLE (FLAGYL) 250 MG tablet Take 1 tablet (250 mg total) by mouth 4 (four) times daily.  . Probiotic Product (PROBIOTIC DAILY PO) Take by mouth.  . ranitidine (ZANTAC) 150 MG capsule Take 1 capsule (150 mg total) by mouth 4 (four) times daily.  Marland Kitchen tetracycline (ACHROMYCIN,SUMYCIN) 500 MG capsule  Take 1 capsule (500 mg total) by mouth 4 (four) times daily.  . cholecalciferol (VITAMIN D) 1000 UNITS tablet Take 2,000 Units by mouth daily.  . Fish Oil-Cholecalciferol (FISH OIL + D3) 1000-1000 MG-UNIT CAPS Take 1,000 mg by mouth.   No current facility-administered medications on file prior to visit.    Review of Systems  Constitutional: Negative for fever and chills.  HENT: Negative.   Respiratory: Negative for cough, chest tightness and wheezing.   Cardiovascular: Negative for chest pain and leg swelling.  Gastrointestinal: Negative for nausea, vomiting, abdominal pain, diarrhea and constipation.  Endocrine: Negative.  Negative for cold intolerance, heat intolerance, polydipsia, polyphagia and polyuria.  Genitourinary: Negative for dysuria, vaginal bleeding, vaginal discharge, difficulty urinating and vaginal pain.  Musculoskeletal: Negative.   Neurological: Negative for dizziness, light-headedness and numbness.  Psychiatric/Behavioral: Negative.    Per HPI unless specifically indicated above     Objective:    BP 133/84 mmHg  Pulse 102  Temp(Src) 98.1 F (36.7 C)  Resp 16  Ht 5\' 4"  (1.626 m)  Wt 160 lb 3.2 oz (72.666 kg)  BMI 27.48 kg/m2  LMP 12/30/2004 (Exact Date)  Wt Readings from Last 3 Encounters:  11/14/14 160 lb 3.2 oz (72.666 kg)  11/02/14 160 lb (72.576 kg)  Physical Exam  Constitutional: She is oriented to person, place, and time. She appears well-developed and well-nourished.  HENT:  Head: Normocephalic and atraumatic.  Neck: Normal range of motion. Neck supple. No thyromegaly present.  Cardiovascular: Normal rate and regular rhythm.  Exam reveals no gallop and no friction rub.   No murmur heard. Pulmonary/Chest: Breath sounds normal. No respiratory distress. She has no wheezes.  Abdominal: Soft. Bowel sounds are normal. She exhibits no distension. There is no tenderness.  Genitourinary: Vagina normal. No breast swelling, tenderness or discharge. There  is no rash or tenderness on the right labia. There is no rash, tenderness or lesion on the left labia. Right adnexum displays no mass, no tenderness and no fullness. Left adnexum displays no mass, no tenderness and no fullness. No tenderness in the vagina.  Musculoskeletal: Normal range of motion. She exhibits no edema or tenderness.  Lymphadenopathy:    She has no cervical adenopathy.  Neurological: She is alert and oriented to person, place, and time.  Skin: Skin is warm and dry.  Psychiatric: She has a normal mood and affect. Her behavior is normal. Judgment normal.   Results for orders placed or performed in visit on 11/14/14  HM MAMMOGRAPHY  Result Value Ref Range   HM Mammogram normal   HM DEXA SCAN  Result Value Ref Range   HM Dexa Scan normal   HM PAP SMEAR  Result Value Ref Range   HM Pap smear normal   HM COLONOSCOPY  Result Value Ref Range   HM Colonoscopy normal       Assessment & Plan:   Problem List Items Addressed This Visit      Other   Vitamin D deficiency    Taking 2000IU vitamin D daily. Recheck lab to ensure WNL.       Relevant Orders   Vit D  25 hydroxy (rtn osteoporosis monitoring)    Other Visit Diagnoses    Well woman exam with routine gynecological exam    -  Primary    Overall doing well. Health maintence UTD.        No orders of the defined types were placed in this encounter.      Follow up plan: Return in about 1 year (around 11/14/2015).

## 2014-11-14 NOTE — Assessment & Plan Note (Signed)
Taking 2000IU vitamin D daily. Recheck lab to ensure WNL.

## 2014-11-15 ENCOUNTER — Telehealth: Payer: Self-pay | Admitting: Family Medicine

## 2014-11-15 DIAGNOSIS — L709 Acne, unspecified: Secondary | ICD-10-CM

## 2014-11-15 NOTE — Telephone Encounter (Signed)
R/T call to patient to get more information on refill request and letter. Pt states she was prescribed this cream in 1998 from provider in [place]. Pt says it is costly and a letter must be written to accompany in order for ins to cover. Pt was seen in office on 11/14/14 and did not mention.

## 2014-11-15 NOTE — Telephone Encounter (Signed)
Pt called requesting a refill on Tretinoin pt also states that she need a letter to go with prescription

## 2014-11-17 MED ORDER — TRETINOIN 0.025 % EX CREA: TOPICAL_CREAM | Freq: Every day | CUTANEOUS | Status: AC

## 2014-11-17 NOTE — Telephone Encounter (Signed)
Refill sent. We can fill out a PA as necessary. She may also need to go to dermatology as sometimes they are more successful in getting this stuff paid for.

## 2014-11-18 ENCOUNTER — Telehealth: Payer: Self-pay | Admitting: *Deleted

## 2014-11-18 NOTE — Telephone Encounter (Signed)
Effective from 11/18/2014 through 11/16/2016. Confirmation faxed to [facility]. Patient notified

## 2014-11-18 NOTE — Telephone Encounter (Signed)
PA submitted via covermymeds for Tretinoin 0.025% cream. Key: [id number] Pending decision sent to [facility] Id # [id number]

## 2014-11-28 ENCOUNTER — Encounter: Payer: Self-pay | Admitting: Family Medicine

## 2015-03-31 ENCOUNTER — Telehealth: Payer: Self-pay

## 2015-03-31 NOTE — Telephone Encounter (Signed)
Called to offer Flu shot. Patient declined but asked about the symptoms of influenza. I went over them and asked her to call us if she changes mind.[facility]

## 2016-05-31 ENCOUNTER — Ambulatory Visit
Admission: RE | Admit: 2016-05-31 | Discharge: 2016-05-31 | Disposition: A | Discharge status: Home / Self Care | Payer: No Typology Code available for payment source | Source: Ambulatory Visit | Attending: Physician Assistant | Admitting: Physician Assistant

## 2016-05-31 ENCOUNTER — Ambulatory Visit (INDEPENDENT_AMBULATORY_CARE_PROVIDER_SITE_OTHER): Payer: No Typology Code available for payment source | Admitting: Physician Assistant

## 2016-05-31 ENCOUNTER — Encounter: Payer: Self-pay | Admitting: Physician Assistant

## 2016-05-31 VITALS — BP 148/89 | HR 95 | Temp 97.6°F | Ht 64.0 in | Wt 165.0 lb

## 2016-05-31 DIAGNOSIS — R109 Unspecified abdominal pain: Secondary | ICD-10-CM | POA: Diagnosis not present

## 2016-05-31 DIAGNOSIS — R935 Abnormal findings on diagnostic imaging of other abdominal regions, including retroperitoneum: Secondary | ICD-10-CM | POA: Diagnosis not present

## 2016-05-31 LAB — POCT URINALYSIS DIPSTICK
Bilirubin, UA: NEGATIVE
Blood, UA: NEGATIVE
Glucose, UA: NEGATIVE
Ketones, UA: NEGATIVE
Nitrite, UA: NEGATIVE
Spec Grav, UA: 1.01
Urobilinogen, UA: 0.2
pH, UA: 7.5

## 2016-05-31 MED ORDER — HYDROCODONE-ACETAMINOPHEN 5-325 MG PO TABS: ORAL_TABLET | ORAL | 0 refills | Status: AC

## 2016-05-31 MED ORDER — TAMSULOSIN HCL 0.4 MG PO CAPS: 0.4000 mg | ORAL_CAPSULE | Freq: Every day | ORAL | 0 refills | Status: AC

## 2016-05-31 MED ORDER — CYCLOBENZAPRINE HCL 10 MG PO TABS: 10.0000 mg | ORAL_TABLET | Freq: Every day | ORAL | 0 refills | Status: AC

## 2016-05-31 NOTE — Progress Notes (Signed)
Subjective:    Patient ID: Helen Harrison, female    DOB: 07/29/1960, 56 y.o.   MRN: [id number]  Helen Harrison is a 56 y.o. female presenting on 05/31/2016 for left side pain   HPI   Patient is a 56 y/o female with history of H. Pylori and hysterectomy with retained ovaries presenting with left flank pain ongoing for two months. The pain has been progressively worsening. It is in her left flank and radiates down into her groin. It is constant and severe, with spasms of more intense pain. She almost went to the ER yesterday. She is having some nausea without vomiting. Some intermittent nonbloody diarrhea. No vaginal bleeding or discharge. She has increase fluids. No dysuria or urinary frequency. No fever, chills.     Past Medical History:  Diagnosis Date  . Acid reflux   . Anemia   . Colon polyps   . History of Helicobacter pylori infection    Past Surgical History:  Procedure Laterality Date  . ABDOMINAL HYSTERECTOMY  2006  . CATARACT EXTRACTION     Social History   Social History  . Marital status: Married    Spouse name: N/A  . Number of children: N/A  . Years of education: N/A   Occupational History  . Not on file.   Social History Main Topics  . Smoking status: Never Smoker  . Smokeless tobacco: Never Used  . Alcohol use 0.0 oz/week     Comment: occasional  . Drug use: No  . Sexual activity: Not on file   Other Topics Concern  . Not on file   Social History Narrative  . No narrative on file   Family History  Problem Relation Age of Onset  . Alcohol abuse Mother   . Alcohol abuse Father   . Ovarian cancer Sister    Current Outpatient Prescriptions on File Prior to Visit  Medication Sig  . cholecalciferol (VITAMIN D) 1000 UNITS tablet Take 2,000 Units by mouth daily.  . Probiotic Product (PROBIOTIC DAILY PO) Take by mouth.  . tretinoin (RETIN-A) 0.025 % cream Apply topically at bedtime.   No current facility-administered medications on file prior to  visit.     Review of Systems Per HPI unless specifically indicated above     Objective:    BP (!) 148/89 (BP Location: Left Arm, Patient Position: Sitting, Cuff Size: Normal)   Pulse 95   Temp 97.6 F (36.4 C) (Oral)   Ht 5\' 4"  (1.626 m)   Wt 165 lb (74.8 kg)   LMP 12/30/2004 (Exact Date)   BMI 28.32 kg/m   Wt Readings from Last 3 Encounters:  05/31/16 165 lb (74.8 kg)  11/14/14 160 lb 3.2 oz (72.7 kg)  11/02/14 160 lb (72.6 kg)    Physical Exam  Constitutional: She is oriented to person, place, and time. She appears well-developed and well-nourished. No distress.  Does appear to be in pain.  Cardiovascular: Normal rate and regular rhythm.   Pulmonary/Chest: Effort normal and breath sounds normal.  Abdominal: Soft. Bowel sounds are normal. She exhibits no distension and no mass. There is no hepatosplenomegaly. There is no tenderness. There is no rigidity, no rebound, no guarding, no CVA tenderness, no tenderness at McBurney's point and negative Murphy's sign.  Neurological: She is alert and oriented to person, place, and time.  Skin: Skin is warm and dry.  Psychiatric: She has a normal mood and affect. Her behavior is normal.   Results for orders placed  or performed in visit on 05/31/16  POCT urinalysis dipstick  Result Value Ref Range   Color, UA yellow    Clarity, UA clear    Glucose, UA neg    Bilirubin, UA neg    Ketones, UA neg    Spec Grav, UA 1.010    Blood, UA neg    pH, UA 7.5    Protein, UA trace    Urobilinogen, UA 0.2    Nitrite, UA neg    Leukocytes, UA small (1+) (A) Negative      Assessment & Plan:   Problem List Items Addressed This Visit    None    Visit Diagnoses    Left flank pain    -  Primary   Relevant Medications   cyclobenzaprine (FLEXERIL) 10 MG tablet   tamsulosin (FLOMAX) 0.4 MG CAPS capsule   HYDROcodone-acetaminophen (NORCO) 5-325 MG tablet   Other Relevant Orders   POCT urinalysis dipstick (Completed)   DG Abd 1 View        Patient is 56 y/o relatively healthy woman presenting with worsening left flank pain. Afebrile and nontoxic in office today, but with elevated BP reading, likely 2/2 pain. Urine negative for infection, low suspicion for UTI, pyelo. Lower suspicion for reproductive pathology like ovarian torsion 2/2 duration of symptoms. Suspect renal calculus, will get KUB to assess initially for renal stones and abdominal pathology. Have treated patient as above with pain relief, muscle relaxers, Flomax. Instructed patient that it will take 1-2 weeks for us to get CT outpatient and that if her pain increases significantly she should go to the ER.    Meds ordered this encounter  Medications  . cyclobenzaprine (FLEXERIL) 10 MG tablet    Sig: Take 1 tablet (10 mg total) by mouth at bedtime.    Dispense:  14 tablet    Refill:  0    Order Specific Question:   Supervising Provider    Answer:   Malva LimesFISHER, DONALD E [[id number]]  . tamsulosin (FLOMAX) 0.4 MG CAPS capsule    Sig: Take 1 capsule (0.4 mg total) by mouth daily.    Dispense:  14 capsule    Refill:  0    Order Specific Question:   Supervising Provider    Answer:   Malva LimesFISHER, DONALD E [[id number]]  . HYDROcodone-acetaminophen (NORCO) 5-325 MG tablet    Sig: 1/2 to 1 tablet every 6 hours    Dispense:  24 tablet    Refill:  0    Order Specific Question:   Supervising Provider    Answer:   Malva LimesFISHER, DONALD E [[id number]]      Follow up plan: Return if symptoms worsen or fail to improve.  Osvaldo AngstAdriana Pollak, PA-C [facility] 05/31/2016, 9:02 AM

## 2016-05-31 NOTE — Patient Instructions (Signed)

## 2016-06-05 ENCOUNTER — Telehealth: Payer: Self-pay | Admitting: Family Medicine

## 2016-06-05 NOTE — Telephone Encounter (Signed)
Reviewed chart, seen on 05/31/16 for same complaint with Left flank and abdominal pain. Given the severity of her complaint, and not improved after one outpatient visit with unremarkable X-ray, Hydrocodone, muscle relaxant Fexeril, and Flomax to help pass a potential kidney stone, I think there are limited options for her to be done acutely in office. We would not be able to obtain an urgent CT Scan, and other testing such as labs would need to be STAT as well and again not ideal for outpatient setting.  Please call patient back to review this information, and my recommendation is that patient be seen sooner in Emergency Department vs [facility] setting, prefer ED with quicker access to CT scan for acute abdominal pain.  Saralyn PilarAlexander Yarely Bebee, DO [facility] 06/05/2016, 10:24 AM

## 2016-06-05 NOTE — Telephone Encounter (Signed)
Pt advised and suggested that ED can do more procedure to rule out  with quicker result vs urgent care or provider's office.

## 2016-06-05 NOTE — Telephone Encounter (Signed)
Called patient back at earliest opportunity, approx 1715, after multiple staff members had contacted patient today with my recommendations below.  Patient reports severe worsening pain, difficulty getting off couch. Both she and her husband were asking about increased dose of pain medicine to "get through the kidney stone". I advised her that we were concerned about a kidney stone as most likely option, but again cannot be 100% positive based on an X-ray and outpatient office visit. She needs more evaluation to make sure no other diagnosis causing her worsening symptoms despite the treatment prescribed for her.  I advised her that I cannot refill or increase the pain medication, it has been less than 1 week since it was prescribed, and she was given an appropriate dose and amount. It is not common practice to continue to refill opiate pain medication for persisting problem, and that she needs further evaluation and treatment, instead of more pain medicine as the only solution.  She admittedly did not follow the advice given earlier today to go to the ED for further evaluation and management, stating to me that [facility] and she is looking for an "alternative way to treat it". I told her that I am trying to look out for her health, and that due to the severity of her complaint and worsening symptoms despite outpatient management, needs higher acuity and attention. I offered one option such as [facility], which should have CT imaging capabilities, she may contact them to confirm this and follow-up with them, otherwise next option is ED.  Saralyn PilarAlexander Kipp Shank, DO [facility] 06/05/2016, 5:22 PM

## 2016-06-05 NOTE — Telephone Encounter (Signed)
Pt was in last week for kidney stones.  She is having severe back pain and abdomen issues.  Her call back number is [phone]

## 2016-06-05 NOTE — Telephone Encounter (Signed)
Patient's spouse called back to ask if pain medication dosage could be increased. He states she is in so much pain she can't get off couch.  As advised earlier they were instructed to go to ED for further evaluation.

## 2016-06-06 ENCOUNTER — Ambulatory Visit
Admit: 2016-06-06 | Discharge: 2016-06-06 | Disposition: A | Discharge status: Home / Self Care | Payer: PRIVATE HEALTH INSURANCE | Attending: Family Medicine | Admitting: Family Medicine

## 2016-06-06 ENCOUNTER — Encounter: Payer: Self-pay | Admitting: *Deleted

## 2016-06-06 ENCOUNTER — Ambulatory Visit
Admission: EM | Admit: 2016-06-06 | Discharge: 2016-06-06 | Disposition: A | Discharge status: Home / Self Care | Payer: PRIVATE HEALTH INSURANCE | Attending: Family Medicine | Admitting: Family Medicine

## 2016-06-06 DIAGNOSIS — K5901 Slow transit constipation: Secondary | ICD-10-CM

## 2016-06-06 DIAGNOSIS — R109 Unspecified abdominal pain: Secondary | ICD-10-CM | POA: Diagnosis not present

## 2016-06-06 DIAGNOSIS — M545 Low back pain, unspecified: Secondary | ICD-10-CM

## 2016-06-06 DIAGNOSIS — Z90710 Acquired absence of both cervix and uterus: Secondary | ICD-10-CM | POA: Insufficient documentation

## 2016-06-06 DIAGNOSIS — K760 Fatty (change of) liver, not elsewhere classified: Secondary | ICD-10-CM | POA: Insufficient documentation

## 2016-06-06 DIAGNOSIS — Q631 Lobulated, fused and horseshoe kidney: Secondary | ICD-10-CM | POA: Diagnosis not present

## 2016-06-06 DIAGNOSIS — R102 Pelvic and perineal pain: Secondary | ICD-10-CM | POA: Diagnosis present

## 2016-06-06 DIAGNOSIS — Z841 Family history of disorders of kidney and ureter: Secondary | ICD-10-CM | POA: Diagnosis not present

## 2016-06-06 HISTORY — DX: Unspecified abdominal pain: R10.9

## 2016-06-06 LAB — URINALYSIS, COMPLETE (UACMP) WITH MICROSCOPIC
BACTERIA UA: NONE SEEN
Bilirubin Urine: NEGATIVE
Glucose, UA: NEGATIVE mg/dL
KETONES UR: NEGATIVE mg/dL
Nitrite: NEGATIVE
PROTEIN: NEGATIVE mg/dL
Specific Gravity, Urine: 1.015 (ref 1.005–1.030)
pH: 6.5 (ref 5.0–8.0)

## 2016-06-06 LAB — COMPREHENSIVE METABOLIC PANEL
ALBUMIN: 4.8 g/dL (ref 3.5–5.0)
ALK PHOS: 98 U/L (ref 38–126)
ALT: 21 U/L (ref 14–54)
ANION GAP: 9 (ref 5–15)
AST: 20 U/L (ref 15–41)
BUN: 13 mg/dL (ref 6–20)
CALCIUM: 9.6 mg/dL (ref 8.9–10.3)
CHLORIDE: 101 mmol/L (ref 101–111)
CO2: 25 mmol/L (ref 22–32)
Creatinine, Ser: 0.58 mg/dL (ref 0.44–1.00)
GFR calc non Af Amer: >60 mL/min (ref >60)
GLUCOSE: 110 mg/dL — AB (ref 65–99)
Potassium: 4.2 mmol/L (ref 3.5–5.1)
SODIUM: 135 mmol/L (ref 135–145)
Total Bilirubin: 0.6 mg/dL (ref 0.3–1.2)
Total Protein: 8.6 g/dL — ABNORMAL HIGH (ref 6.5–8.1)

## 2016-06-06 LAB — CBC WITH DIFFERENTIAL/PLATELET
BASOS ABS: 0.1 10*3/uL (ref 0–0.1)
BASOS PCT: 1 %
Eosinophils Absolute: 0.1 10*3/uL (ref 0–0.7)
Eosinophils Relative: 2 %
HEMATOCRIT: 41.4 % (ref 35.0–47.0)
Hemoglobin: 13.8 g/dL (ref 12.0–16.0)
LYMPHS PCT: 17 %
Lymphs Abs: 1.5 10*3/uL (ref 1.0–3.6)
MCH: 27.5 pg (ref 26.0–34.0)
MCHC: 33.4 g/dL (ref 32.0–36.0)
MCV: 82.4 fL (ref 80.0–100.0)
MONO ABS: 0.6 10*3/uL (ref 0.2–0.9)
Monocytes Relative: 7 %
NEUTROS ABS: 6.7 10*3/uL — AB (ref 1.4–6.5)
Neutrophils Relative %: 73 %
PLATELETS: 313 10*3/uL (ref 150–440)
RBC: 5.03 MIL/uL (ref 3.80–5.20)
RDW: 13.7 % (ref 11.5–14.5)
WBC: 9.1 10*3/uL (ref 3.6–11.0)

## 2016-06-06 LAB — AMYLASE: Amylase: 50 U/L (ref 28–100)

## 2016-06-06 LAB — LIPASE, BLOOD: LIPASE: 20 U/L (ref 11–51)

## 2016-06-06 MED ORDER — CIPROFLOXACIN HCL 500 MG PO TABS: 500.0000 mg | ORAL_TABLET | Freq: Two times a day (BID) | ORAL | 0 refills | Status: AC

## 2016-06-06 MED ORDER — KETOROLAC TROMETHAMINE 60 MG/2ML IM SOLN
60.0000 mg | Freq: Once | INTRAMUSCULAR | Status: AC
  Administered 2016-06-06: 60 mg via INTRAMUSCULAR

## 2016-06-06 NOTE — ED Provider Notes (Signed)
[facility]    CSN: [id number] Arrival date & time: 06/06/16  8295     History   Chief Complaint Chief Complaint  Patient presents with  . Back Pain  . Flank Pain    HPI Helen Harrison is a 56 y.o. female.   Patient comes in today for a CT scan of her abdomen. She states that she's had this flank pain and abdominal pain since last week. Her PCP placed her on Flexeril and Vicodin and Flomax. She has not recovered any type of stone material but she continues to have pain only right flank and now both sides of her back and over her abdomen as well. She states she's not had a bowel movement since last week Thursday when she was started on the Vicodin. She is worried about having a stone. She has had abdominal hysterectomy but she does have both ovaries. Apparently a flat plate of abdomen done as an outpatient last week question whether there was a stone present. Explained to her that we don't always have a tech here available CT scans but she states that she called ahead of time to make sure we had a tach and they assured her the registration that that be done and she states the doctor did not tell go to the ED but come here to the [facility]. She has a a niece who's had kidney stones before and says a history of acid reflux anemia colonic polyps and H. pylori in the past. Asked family medical history both mother and father were alcoholics and she has a sister who had ovarian cancer. She never smoked and she is allergic to amoxicillin and the penicillins.   The history is provided by the patient. No language interpreter was used.  Back Pain  Location:  Lumbar spine Quality:  Aching Pain is:  Worse during the day Onset quality:  Unable to specify Timing:  Constant Progression:  Worsening Chronicity:  New Associated symptoms: abdominal pain   Associated symptoms: no chest pain and no headaches   Flank Pain  This is a new problem. The current episode started more than  1 week ago. The problem occurs constantly. The problem has been gradually worsening. Associated symptoms include abdominal pain. Pertinent negatives include no chest pain, no headaches and no shortness of breath. Nothing aggravates the symptoms. Nothing relieves the symptoms. She has tried nothing for the symptoms.    Past Medical History:  Diagnosis Date  . Acid reflux   . Anemia   . Colon polyps   . History of Helicobacter pylori infection   . Left flank pain     Patient Active Problem List   Diagnosis Date Noted  . Abnormal weight gain 11/14/2014  . Absolute anemia 11/14/2014  . Colon polyp 11/14/2014  . Gastro-esophageal reflux disease without esophagitis 11/14/2014  . Awareness of heartbeats 11/14/2014  . LBP (low back pain) 11/14/2014  . Menopausal symptom 11/14/2014  . Vitamin D deficiency 11/14/2014  . Acid reflux 01/28/2011  . Helicobacter pylori infection 01/28/2011  . Acne 01/07/2011  . Heartburn 01/07/2011    Past Surgical History:  Procedure Laterality Date  . ABDOMINAL HYSTERECTOMY  2006  . CATARACT EXTRACTION      OB History    No data available       Home Medications    Prior to Admission medications   Medication Sig Start Date End Date Taking? Authorizing Provider  cholecalciferol (VITAMIN D) 1000 UNITS tablet Take 2,000 Units  by mouth daily.   Yes Historical Provider, MD  cyclobenzaprine (FLEXERIL) 10 MG tablet Take 1 tablet (10 mg total) by mouth at bedtime. 05/31/16  Yes Trey Sailors, PA-C  HYDROcodone-acetaminophen (NORCO) 5-325 MG tablet 1/2 to 1 tablet every 6 hours 05/31/16  Yes Trey Sailors, PA-C  Probiotic Product (PROBIOTIC DAILY PO) Take by mouth.   Yes Historical Provider, MD  tamsulosin (FLOMAX) 0.4 MG CAPS capsule Take 1 capsule (0.4 mg total) by mouth daily. 05/31/16 06/14/16 Yes Adriana Jac Canavan, PA-C  tretinoin (RETIN-A) 0.025 % cream Apply topically at bedtime. 11/17/14  Yes Amy Rusty Aus, NP  ciprofloxacin (CIPRO) 500 MG tablet  Take 1 tablet (500 mg total) by mouth 2 (two) times daily. 06/06/16   Hassan Rowan, MD    Family History Family History  Problem Relation Age of Onset  . Alcohol abuse Mother   . Alcohol abuse Father   . Ovarian cancer Sister     Social History Social History  Substance Use Topics  . Smoking status: Never Smoker  . Smokeless tobacco: Never Used  . Alcohol use 0.0 oz/week     Comment: occasional     Allergies   Amoxicillin and Penicillins   Review of Systems Review of Systems  Respiratory: Negative for shortness of breath.   Cardiovascular: Negative for chest pain.  Gastrointestinal: Positive for abdominal pain.  Genitourinary: Positive for flank pain.  Musculoskeletal: Positive for back pain.  Neurological: Negative for headaches.  All other systems reviewed and are negative.    Physical Exam Triage Vital Signs ED Triage Vitals  Enc Vitals Group     BP 06/06/16 1112 101/79     Pulse Rate 06/06/16 1112 (!) 135     Resp 06/06/16 1112 16     Temp 06/06/16 1112 (!) 89.1 F (31.7 C)     Temp Source 06/06/16 1112 Oral     SpO2 06/06/16 1112 98 %     Weight --      Height --      Head Circumference --      Peak Flow --      Pain Score 06/06/16 1114 10     Pain Loc --      Pain Edu? --      Excl. in GC? --    No data found.   Updated Vital Signs BP 101/79 (BP Location: Left Arm)   Pulse (!) 135   Temp (!) 89.1 F (31.7 C) (Oral)   Resp 16   LMP 12/30/2004 (Exact Date)   SpO2 98%   Visual Acuity Right Eye Distance:   Left Eye Distance:   Bilateral Distance:    Right Eye Near:   Left Eye Near:    Bilateral Near:     Physical Exam  Constitutional: She is oriented to person, place, and time. She appears well-developed and well-nourished.  HENT:  Head: Normocephalic and atraumatic.  Eyes: Pupils are equal, round, and reactive to light.  Neck: Normal range of motion. Neck supple.  Cardiovascular: Normal rate and regular rhythm.   Pulmonary/Chest:  Effort normal and breath sounds normal.  Abdominal: She exhibits distension. She exhibits no mass. Bowel sounds are decreased. There is no hepatosplenomegaly, splenomegaly or hepatomegaly. There is tenderness in the suprapubic area. There is CVA tenderness. There is no guarding. No hernia.  Patient has tenderness over the both sides of lower back right sided flank pain and also seems to be slightly distended and bowel sounds are decreased  Musculoskeletal: Normal range of motion.  Neurological: She is alert and oriented to person, place, and time.  Skin: Skin is warm.  Psychiatric: She has a normal mood and affect.  Vitals reviewed.    UC Treatments / Results  Labs (all labs ordered are listed, but only abnormal results are displayed) Labs Reviewed  URINALYSIS, COMPLETE (UACMP) WITH MICROSCOPIC - Abnormal; Notable for the following:       Result Value   Hgb urine dipstick TRACE (*)    Leukocytes, UA SMALL (*)    Squamous Epithelial / LPF 0-5 (*)    All other components within normal limits  CBC WITH DIFFERENTIAL/PLATELET - Abnormal; Notable for the following:    Neutro Abs 6.7 (*)    All other components within normal limits  COMPREHENSIVE METABOLIC PANEL - Abnormal; Notable for the following:    Glucose, Bld 110 (*)    Total Protein 8.6 (*)    All other components within normal limits  URINE CULTURE  AMYLASE  LIPASE, BLOOD    EKG  EKG Interpretation None       Radiology Ct Renal Stone Study  Result Date: 06/06/2016 CLINICAL DATA:  Left flank, back and pelvic pain for 1 week. EXAM: CT ABDOMEN AND PELVIS WITHOUT CONTRAST TECHNIQUE: Multidetector CT imaging of the abdomen and pelvis was performed following the standard protocol without IV contrast. COMPARISON:  05/31/2016 abdominal radiograph. FINDINGS: Lower chest: No significant pulmonary nodules or acute consolidative airspace disease. Hepatobiliary: Diffuse hepatic steatosis. Normal liver size. No liver mass. Normal  gallbladder with no radiopaque cholelithiasis. No biliary ductal dilatation. Pancreas: Normal, with no mass or duct dilation. Spleen: Normal size. No mass. Adrenals/Urinary Tract: Normal adrenals. Horseshoe kidney. No renal stones. No hydronephrosis. No contour deforming renal mass. Normal caliber ureters, with no ureteral stones . Collapsed and grossly normal bladder with no bladder stones. Normal bladder. Stomach/Bowel: Grossly normal stomach. Normal caliber small bowel with no small bowel wall thickening. Normal appendix. Normal large bowel with no diverticulosis, large bowel wall thickening or pericolonic fat stranding. Vascular/Lymphatic: Normal caliber abdominal aorta. No pathologically enlarged lymph nodes in the abdomen or pelvis. Reproductive: Status post hysterectomy, with no abnormal findings at the vaginal cuff. No adnexal mass. Other: No pneumoperitoneum, ascites or focal fluid collection. Musculoskeletal: No aggressive appearing focal osseous lesions. IMPRESSION: 1. No acute abnormality. 2. Horseshoe kidney. No urolithiasis. No evidence of urinary tract obstruction. 3. No evidence of bowel obstruction or acute bowel inflammation. 4. Diffuse hepatic steatosis. Electronically Signed   By: Delbert Phenix M.D.   On: 06/06/2016 13:42    Procedures Procedures (including critical care time)  Medications Ordered in UC Medications  ketorolac (TORADOL) injection 60 mg (60 mg Intramuscular Given 06/06/16 1146)   Results for orders placed or performed during the hospital encounter of 06/06/16  Urinalysis, Complete w Microscopic  Result Value Ref Range   Color, Urine YELLOW YELLOW   APPearance CLEAR CLEAR   Specific Gravity, Urine 1.015 1.005 - 1.030   pH 6.5 5.0 - 8.0   Glucose, UA NEGATIVE NEGATIVE mg/dL   Hgb urine dipstick TRACE (A) NEGATIVE   Bilirubin Urine NEGATIVE NEGATIVE   Ketones, ur NEGATIVE NEGATIVE mg/dL   Protein, ur NEGATIVE NEGATIVE mg/dL   Nitrite NEGATIVE NEGATIVE   Leukocytes,  UA SMALL (A) NEGATIVE   Squamous Epithelial / LPF 0-5 (A) NONE SEEN   WBC, UA 6-30 0 - 5 WBC/hpf   RBC / HPF 0-5 0 - 5 RBC/hpf   Bacteria, UA NONE  SEEN NONE SEEN  CBC with Differential  Result Value Ref Range   WBC 9.1 3.6 - 11.0 K/uL   RBC 5.03 3.80 - 5.20 MIL/uL   Hemoglobin 13.8 12.0 - 16.0 g/dL   HCT 96.041.4 45.435.0 - 09.847.0 %   MCV 82.4 80.0 - 100.0 fL   MCH 27.5 26.0 - 34.0 pg   MCHC 33.4 32.0 - 36.0 g/dL   RDW 11.913.7 14.711.5 - 82.914.5 %   Platelets 313 150 - 440 K/uL   Neutrophils Relative % 73 %   Neutro Abs 6.7 (H) 1.4 - 6.5 K/uL   Lymphocytes Relative 17 %   Lymphs Abs 1.5 1.0 - 3.6 K/uL   Monocytes Relative 7 %   Monocytes Absolute 0.6 0.2 - 0.9 K/uL   Eosinophils Relative 2 %   Eosinophils Absolute 0.1 0 - 0.7 K/uL   Basophils Relative 1 %   Basophils Absolute 0.1 0 - 0.1 K/uL  Amylase  Result Value Ref Range   Amylase 50 28 - 100 U/L  Lipase, blood  Result Value Ref Range   Lipase 20 11 - 51 U/L  Comprehensive metabolic panel  Result Value Ref Range   Sodium 135 135 - 145 mmol/L   Potassium 4.2 3.5 - 5.1 mmol/L   Chloride 101 101 - 111 mmol/L   CO2 25 22 - 32 mmol/L   Glucose, Bld 110 (H) 65 - 99 mg/dL   BUN 13 6 - 20 mg/dL   Creatinine, Ser 5.620.58 0.44 - 1.00 mg/dL   Calcium 9.6 8.9 - 13.010.3 mg/dL   Total Protein 8.6 (H) 6.5 - 8.1 g/dL   Albumin 4.8 3.5 - 5.0 g/dL   AST 20 15 - 41 U/L   ALT 21 14 - 54 U/L   Alkaline Phosphatase 98 38 - 126 U/L   Total Bilirubin 0.6 0.3 - 1.2 mg/dL   GFR calc non Af Amer >60 >60 mL/min   GFR calc Af Amer >60 >60 mL/min   Anion gap 9 5 - 15    Initial Impression / Assessment and Plan / UC Course  I have reviewed the triage vital signs and the nursing notes.  Pertinent labs & imaging results that were available during my care of the patient were reviewed by me and considered in my medical decision making (see chart for details).    Patient is here because of abdominal pain back pain the decreased bowel sounds and the abdominal  distention may be secondary to const[facility] was Tylenol 3 but is really Vicodin .  Explained patient that her PCP probably should have referred her to the ER to get a CT scan or doing outpatient CT scan from his office. We do have a tech here  today fortunately but will still need to go through insurance to get approval to have a CT scan done.  Final Clinical Impressions(s) / UC Diagnoses   Final diagnoses:  Right flank pain  Acute bilateral low back pain without sciatica  Slow transit constipation  Family history of kidney stone    New Prescriptions Discharge Medication List as of 06/06/2016  1:11 PM    Her insurances refused to cover for CT scan so she agreed take a half a CT scan anyway. CT scan showed a horseshoe kidney but no signs of obstruction. We'll place her on Cipro 500 one tablet twice a day for a week until urine culture comes back we'll treat for possible UTI since there was a slight amount  of blood present. Also recommend that she follow-up for PCP if the pain continues and that she stops the Vicodin Fosamax citrate to take care of the constipation that may be part of cause the pain that she's having. Follow-up with ED with a PCP if pain persist. She will call back in 48 hours make sure urine cultures positive and is negative she may want to stop Cipro.    Note: This dictation was prepared with Dragon dictation along with smaller phrase technology. Any transcriptional errors that result from this process are unintentional.   Hassan Rowan, MD 06/06/16 1426

## 2016-06-06 NOTE — ED Triage Notes (Signed)
Patient started having symptoms of left flank pain, back pain, and pelvic pain one week ago. Patient went to her PCP 6 days ago. Xray showed a possible kidney stone on the left side. Patient is here today as per her PCP for a CT scan.

## 2016-06-07 ENCOUNTER — Ambulatory Visit: Payer: Self-pay | Admitting: Physician Assistant

## 2016-06-07 LAB — URINE CULTURE

## 2016-06-24 ENCOUNTER — Other Ambulatory Visit: Payer: Self-pay | Admitting: Family Medicine

## 2016-06-24 DIAGNOSIS — Z20828 Contact with and (suspected) exposure to other viral communicable diseases: Secondary | ICD-10-CM

## 2016-06-24 DIAGNOSIS — J111 Influenza due to unidentified influenza virus with other respiratory manifestations: Secondary | ICD-10-CM

## 2016-06-24 MED ORDER — OSELTAMIVIR PHOSPHATE 75 MG PO CAPS: 75.0000 mg | ORAL_CAPSULE | Freq: Two times a day (BID) | ORAL | 0 refills | Status: AC

## 2016-06-24 NOTE — Progress Notes (Signed)
Saw patient's husband, Vedia PereyraRobert Friley today in office visit. He was confirmed diagnosed with Influenza Type B, and started on Tamiflu, his symptoms started within 72 hours. He reported that his wife, Edyth GunnelsLea Ann Taff close contact with new flu-like symptoms starting today. She did not present to office or get flu tested, but husband agrees to send her Tamiflu medication as well for treatment dose starting today.  Saralyn PilarAlexander Karamalegos, DO [facility] 06/24/2016, 2:23 PM

## 2018-05-27 ENCOUNTER — Other Ambulatory Visit: Payer: Self-pay

## 2018-05-27 ENCOUNTER — Ambulatory Visit
Admission: EM | Admit: 2018-05-27 | Discharge: 2018-05-27 | Disposition: A | Discharge status: Home / Self Care | Payer: No Typology Code available for payment source | Attending: Family Medicine | Admitting: Family Medicine

## 2018-05-27 ENCOUNTER — Ambulatory Visit (INDEPENDENT_AMBULATORY_CARE_PROVIDER_SITE_OTHER): Payer: No Typology Code available for payment source

## 2018-05-27 DIAGNOSIS — R222 Localized swelling, mass and lump, trunk: Secondary | ICD-10-CM | POA: Diagnosis not present

## 2018-05-27 DIAGNOSIS — R0789 Other chest pain: Secondary | ICD-10-CM | POA: Diagnosis not present

## 2018-05-27 DIAGNOSIS — R1012 Left upper quadrant pain: Secondary | ICD-10-CM

## 2018-05-27 LAB — URINALYSIS, COMPLETE (UACMP) WITH MICROSCOPIC
Bacteria, UA: NONE SEEN
Bilirubin Urine: NEGATIVE
GLUCOSE, UA: NEGATIVE mg/dL
KETONES UR: NEGATIVE mg/dL
LEUKOCYTE UA: NEGATIVE
Nitrite: NEGATIVE
PH: 5 (ref 5.0–8.0)
Protein, ur: NEGATIVE mg/dL
Specific Gravity, Urine: 1.02 (ref 1.005–1.030)
Squamous Epithelial / LPF: NONE SEEN (ref 0–5)
WBC, UA: NONE SEEN WBC/hpf (ref 0–5)

## 2018-05-27 LAB — CBC WITH DIFFERENTIAL/PLATELET
Abs Immature Granulocytes: 0.02 10*3/uL (ref 0.00–0.07)
BASOS PCT: 0 %
Basophils Absolute: 0 10*3/uL (ref 0.0–0.1)
EOS ABS: 0.1 10*3/uL (ref 0.0–0.5)
EOS PCT: 2 %
HEMATOCRIT: 42.4 % (ref 36.0–46.0)
Hemoglobin: 13.8 g/dL (ref 12.0–15.0)
IMMATURE GRANULOCYTES: 0 %
LYMPHS ABS: 1.5 10*3/uL (ref 0.7–4.0)
Lymphocytes Relative: 22 %
MCH: 28.2 pg (ref 26.0–34.0)
MCHC: 32.5 g/dL (ref 30.0–36.0)
MCV: 86.7 fL (ref 80.0–100.0)
MONOS PCT: 9 %
Monocytes Absolute: 0.6 10*3/uL (ref 0.1–1.0)
NEUTROS PCT: 67 %
Neutro Abs: 4.4 10*3/uL (ref 1.7–7.7)
PLATELETS: 287 10*3/uL (ref 150–400)
RBC: 4.89 MIL/uL (ref 3.87–5.11)
RDW: 13.2 % (ref 11.5–15.5)
WBC: 6.7 10*3/uL (ref 4.0–10.5)
nRBC: 0 % (ref 0.0–0.2)

## 2018-05-27 LAB — BASIC METABOLIC PANEL
Anion gap: 11 (ref 5–15)
BUN: 16 mg/dL (ref 6–20)
CALCIUM: 9.5 mg/dL (ref 8.9–10.3)
CO2: 26 mmol/L (ref 22–32)
Chloride: 101 mmol/L (ref 98–111)
Creatinine, Ser: 0.56 mg/dL (ref 0.44–1.00)
GFR calc Af Amer: >60 mL/min (ref >60)
GLUCOSE: 106 mg/dL — AB (ref 70–99)
Potassium: 3.9 mmol/L (ref 3.5–5.1)
Sodium: 138 mmol/L (ref 135–145)

## 2018-05-27 NOTE — Discharge Instructions (Signed)
Recommend follow up with General Surgeon for further evaluation and management

## 2018-05-27 NOTE — ED Provider Notes (Signed)
[facility]    CSN: [id number] Arrival date & time: 05/27/18  1218     History   Chief Complaint Chief Complaint  Patient presents with  . Abdominal Pain  . Appointment    HPI Helen Harrison is a 58 y.o. female.   58 yo female with a c/o left upper abdominal and left lower chest wall pain for 3-4 months. Denies any injuries, falls, vomiting, fevers, chills, cough, diarrhea, constipation, dysuria, hematuria.   The history is provided by the patient.    Past Medical History:  Diagnosis Date  . Acid reflux   . Anemia   . Colon polyps   . History of Helicobacter pylori infection   . Left flank pain     Patient Active Problem List   Diagnosis Date Noted  . Abnormal weight gain 11/14/2014  . Absolute anemia 11/14/2014  . Colon polyp 11/14/2014  . Gastro-esophageal reflux disease without esophagitis 11/14/2014  . Awareness of heartbeats 11/14/2014  . LBP (low back pain) 11/14/2014  . Menopausal symptom 11/14/2014  . Vitamin D deficiency 11/14/2014  . Acid reflux 01/28/2011  . Helicobacter pylori infection 01/28/2011  . Acne 01/07/2011  . Heartburn 01/07/2011    Past Surgical History:  Procedure Laterality Date  . ABDOMINAL HYSTERECTOMY  2006  . CATARACT EXTRACTION      OB History   No obstetric history on file.      Home Medications    Prior to Admission medications   Medication Sig Start Date End Date Taking? Authorizing Provider  cholecalciferol (VITAMIN D) 1000 UNITS tablet Take 2,000 Units by mouth daily.    [provider]  ciprofloxacin (CIPRO) 500 MG tablet Take 1 tablet (500 mg total) by mouth 2 (two) times daily. 06/06/16   Hassan Rowan, MD  cyclobenzaprine (FLEXERIL) 10 MG tablet Take 1 tablet (10 mg total) by mouth at bedtime. 05/31/16   Trey Sailors, PA-C  HYDROcodone-acetaminophen [facility]) 5-325 MG tablet 1/2 to 1 tablet every 6 hours 05/31/16   Trey Sailors, PA-C  oseltamivir (TAMIFLU) 75 MG capsule Take 1 capsule  (75 mg total) by mouth 2 (two) times daily. For 5 days 06/24/16   Smitty Cords, DO  Probiotic Product (PROBIOTIC DAILY PO) Take by mouth.    [provider]  tretinoin (RETIN-A) 0.025 % cream Apply topically at bedtime. 11/17/14   Loura Pardon, NP    Family History Family History  Problem Relation Age of Onset  . Alcohol abuse Mother   . Alcohol abuse Father   . Ovarian cancer Sister     Social History Social History   Tobacco Use  . Smoking status: Never Smoker  . Smokeless tobacco: Never Used  Substance Use Topics  . Alcohol use: Yes    Alcohol/week: 0.0 standard drinks    Comment: occasional  . Drug use: No     Allergies   Amoxicillin and Penicillins   Review of Systems Review of Systems   Physical Exam Triage Vital Signs ED Triage Vitals  Enc Vitals Group     BP 05/27/18 1235 (!) 154/119     Pulse Rate 05/27/18 1235 93     Resp 05/27/18 1235 18     Temp 05/27/18 1235 98.2 F (36.8 C)     Temp Source 05/27/18 1235 Oral     SpO2 05/27/18 1235 96 %     Weight 05/27/18 1236 160 lb (72.6 kg)     Height 05/27/18 1236 5\' 4"  (1.626  m)     Head Circumference --      Peak Flow --      Pain Score 05/27/18 1236 1     Pain Loc --      Pain Edu? --      Excl. in GC? --    No data found.  Updated Vital Signs BP (!) 154/119 (BP Location: Left Arm)   Pulse 93   Temp 98.2 F (36.8 C) (Oral)   Resp 18   Ht  (1.626 m)   Wt 72.6 kg   LMP 12/30/2004 (Exact Date)   SpO2 96%   BMI 27.46 kg/m   Visual Acuity Right Eye Distance:   Left Eye Distance:   Bilateral Distance:    Right Eye Near:   Left Eye Near:    Bilateral Near:     Physical Exam Vitals signs and nursing note reviewed.  Constitutional:      General: She is not in acute distress.    Appearance: Normal appearance. She is well-developed. She is not toxic-appearing or diaphoretic.  Cardiovascular:     Rate and Rhythm: Normal rate and regular rhythm.     Heart  sounds: Normal heart sounds.  Pulmonary:     Effort: Pulmonary effort is normal. No respiratory distress.     Breath sounds: Normal breath sounds. No stridor. No wheezing, rhonchi or rales.  Chest:     Chest wall: Tenderness (left lower rib area subcutaneous soft palpable mass) present.  Abdominal:     General: Bowel sounds are normal. There is no distension.     Palpations: Abdomen is soft. There is no mass.     Tenderness: There is no abdominal tenderness. There is no right CVA tenderness, left CVA tenderness, guarding or rebound.     Hernia: No hernia is present.  Neurological:     Mental Status: She is alert.      UC Treatments / Results  Labs (all labs ordered are listed, but only abnormal results are displayed) Labs Reviewed  BASIC METABOLIC PANEL - Abnormal; Notable for the following components:      Result Value   Glucose, Bld 106 (*)    All other components within normal limits  URINALYSIS, COMPLETE (UACMP) WITH MICROSCOPIC - Abnormal; Notable for the following components:   Color, Urine STRAW (*)    Hgb urine dipstick TRACE (*)    All other components within normal limits  CBC WITH DIFFERENTIAL/PLATELET    EKG None  Radiology Dg Chest 2 View  Result Date: 05/27/2018 CLINICAL DATA:  Left upper abdominal pain EXAM: CHEST - 2 VIEW COMPARISON:  None. FINDINGS: The heart size and mediastinal contours are within normal limits. Both lungs are clear. The visualized skeletal structures are unremarkable. IMPRESSION: No active cardiopulmonary disease. Electronically Signed   By: Marlan Palau M.D.   On: 05/27/2018 14:09   Dg Abd 2 Views  Result Date: 05/27/2018 CLINICAL DATA:  Left upper abdominal pain EXAM: ABDOMEN - 2 VIEW COMPARISON:  05/31/2016 FINDINGS: Mild amount of stool in the colon. Negative for bowel obstruction. No dilated loops and no air-fluid level. No free air. Negative for urinary tract calculi. IMPRESSION: Mild amount of stool in the colon. Nonobstructive  bowel gas pattern. Electronically Signed   By: Marlan Palau M.D.   On: 05/27/2018 14:12    Procedures Procedures (including critical care time)  Medications Ordered in UC Medications - No data to display  Initial Impression / Assessment and Plan / UC Course  I have reviewed the triage vital signs and the nursing notes.  Pertinent labs & imaging results that were available during my care of the patient were reviewed by me and considered in my medical decision making (see chart for details).      Final Clinical Impressions(s) / UC Diagnoses   Final diagnoses:  Chest wall mass  Chest wall pain  Left upper quadrant pain     Discharge Instructions     Recommend follow up with General Surgeon for further evaluation and management    ED Prescriptions    None     1. Labs/x-ray results and diagnosis reviewed with patient 2. Recommend follow up with general surgeon for further evaluation of soft tissue mass 3. Recommend supportive treatment with otc analgesics prn 4. Follow-up prn if symptoms worsen or don't improve     Controlled Substance Prescriptions [place] Controlled Substance Registry consulted? Not Applicable   Payton Mccallum, MD 05/27/18 [phone]

## 2018-05-27 NOTE — ED Triage Notes (Signed)
Pt states pain to left upper side and radiates into left upper quadrant. Has been there for past 3-4 months. "Nagging pain."  Nothing seems to make it better or worse.

## 2020-09-18 IMAGING — CR DG ABDOMEN 2V
3 series · 3 of 3 positions shown · non-contrast
Comparison: 05/31/2016

CLINICAL DATA: Left upper abdominal pain

EXAM:
ABDOMEN - 2 VIEW

[abdomen erect]
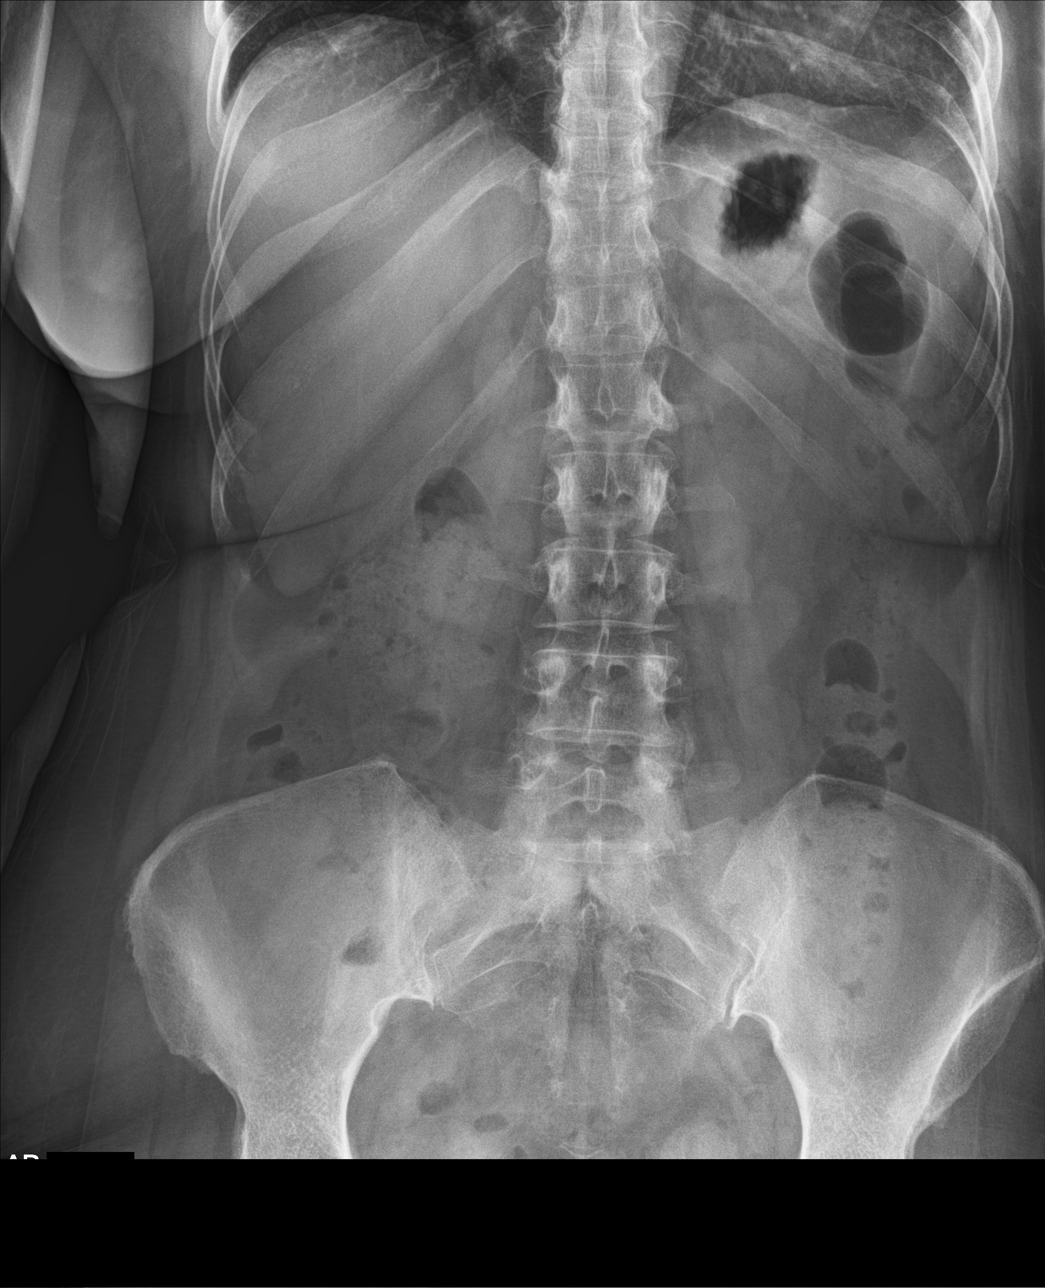

[abdomen supine (1 of 2)]
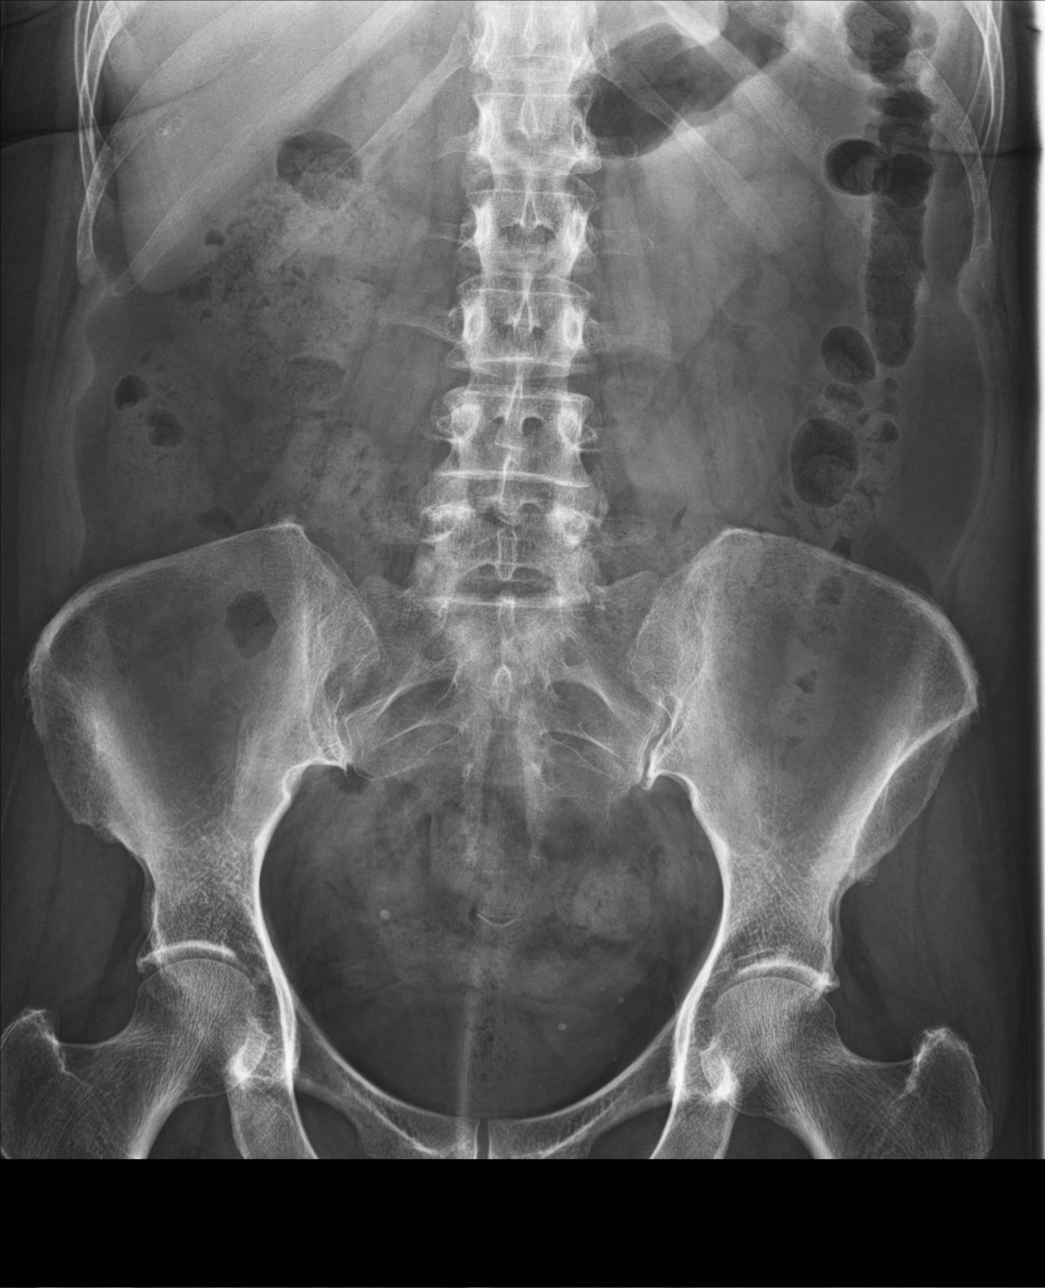

[abdomen supine (2 of 2)]
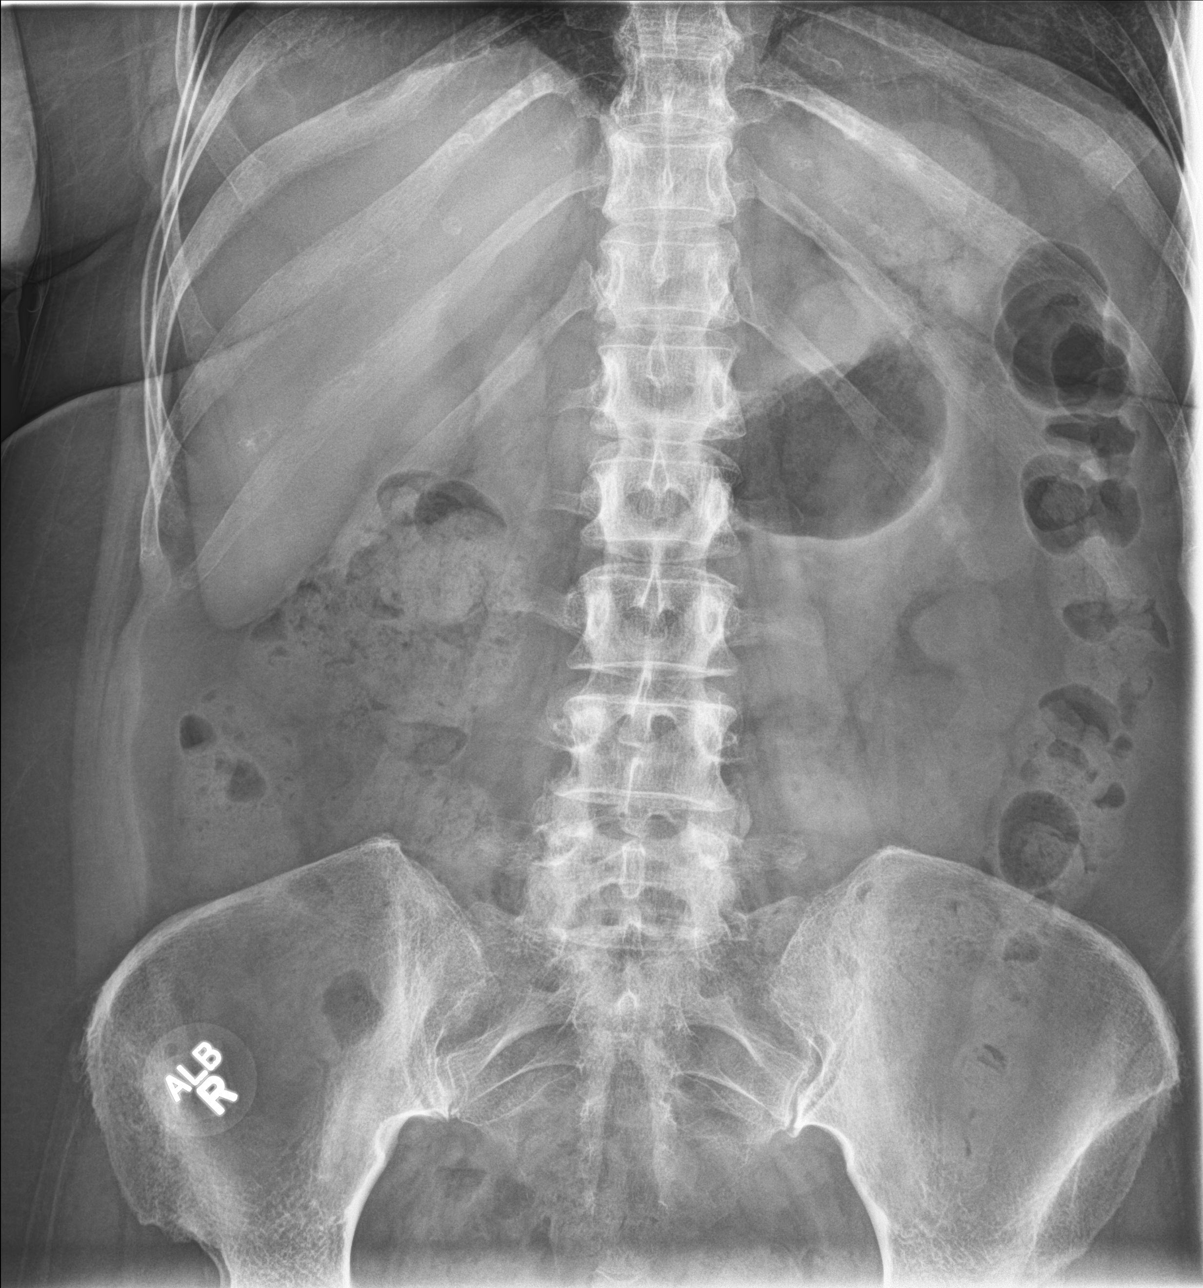

[3 of 3 positions shown; findings below may reference images not displayed]

FINDINGS: Mild amount of stool in the colon. Negative for bowel obstruction.
No dilated loops and no air-fluid level. No free air. Negative for
urinary tract calculi.
IMPRESSION: Mild amount of stool in the colon. Nonobstructive bowel gas pattern.

## 2020-09-18 IMAGING — CR DG CHEST 2V
2 series · 2 of 2 positions shown · non-contrast
Comparison: None.

CLINICAL DATA: Left upper abdominal pain

EXAM:
CHEST - 2 VIEW

[chest pa]
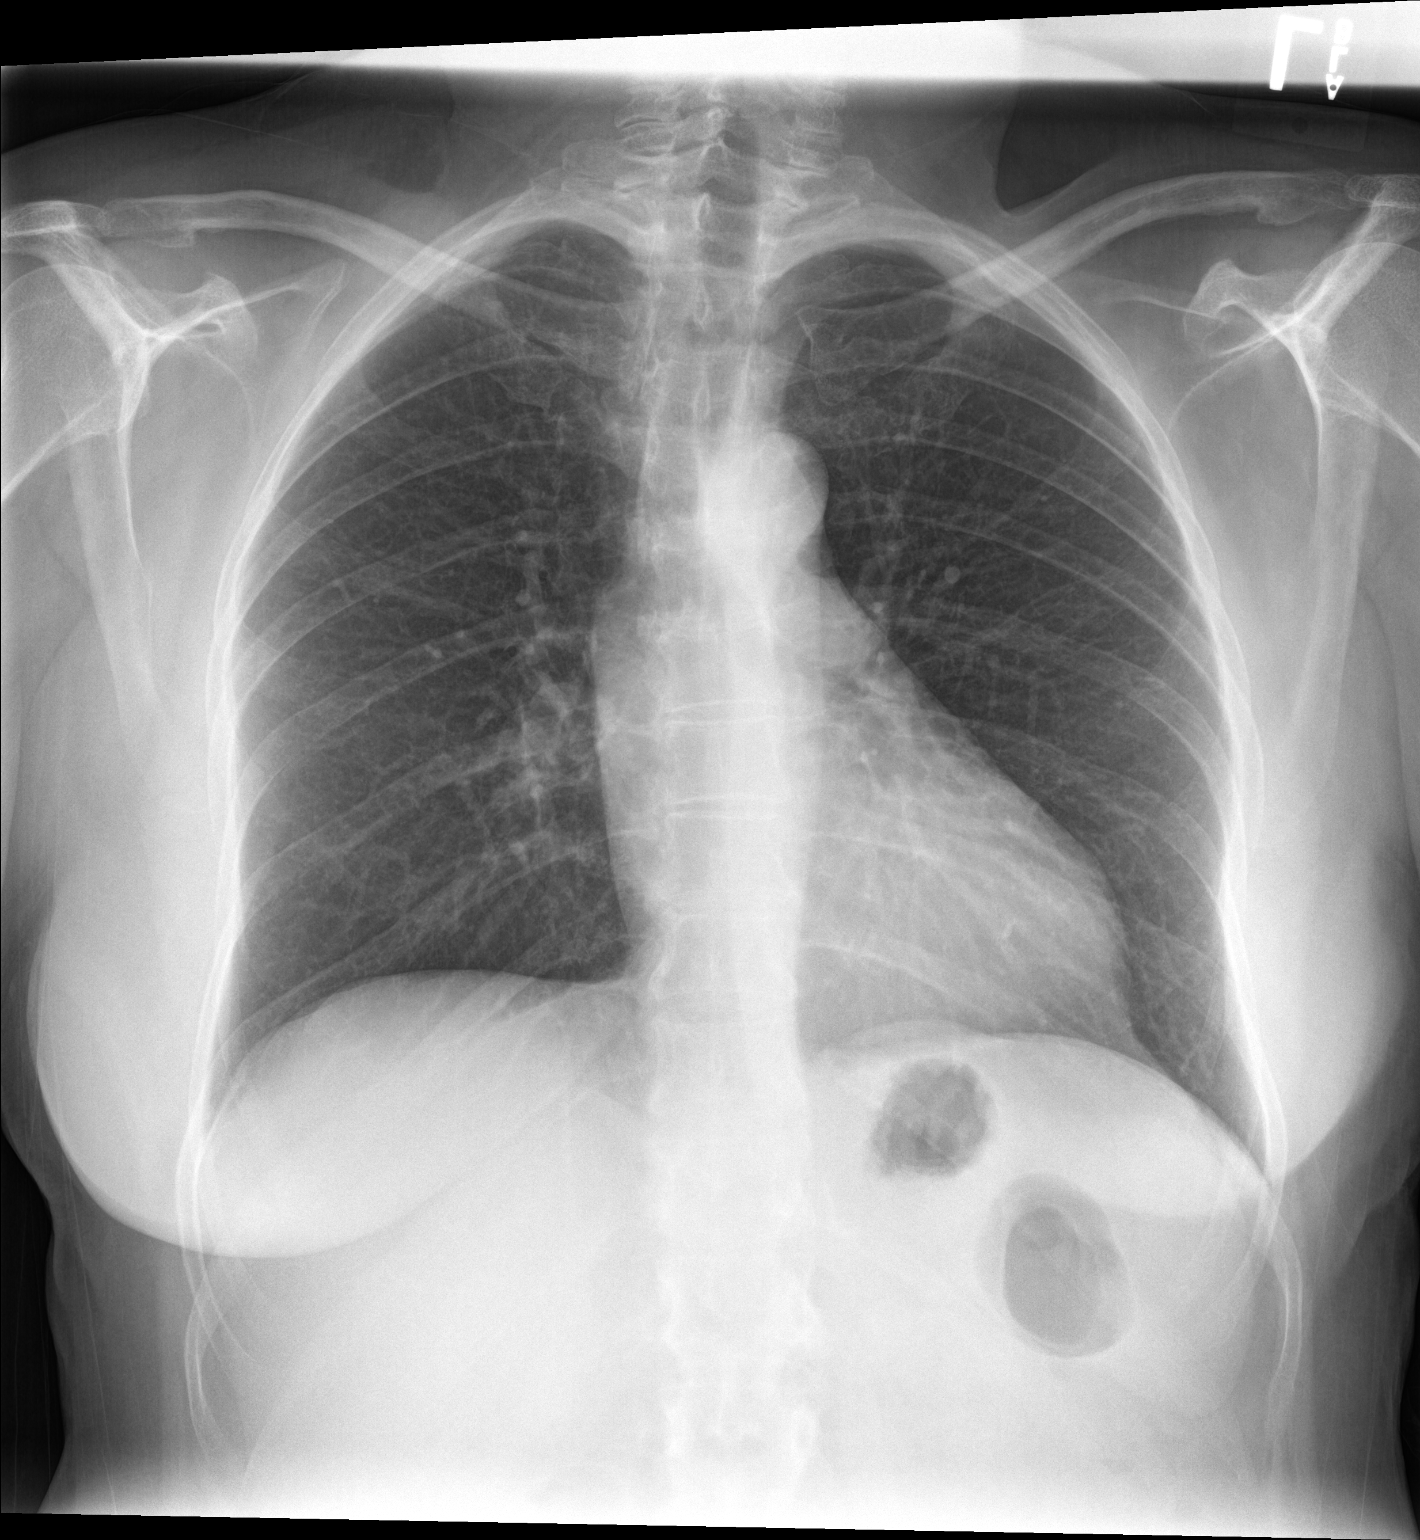

[chest lat]
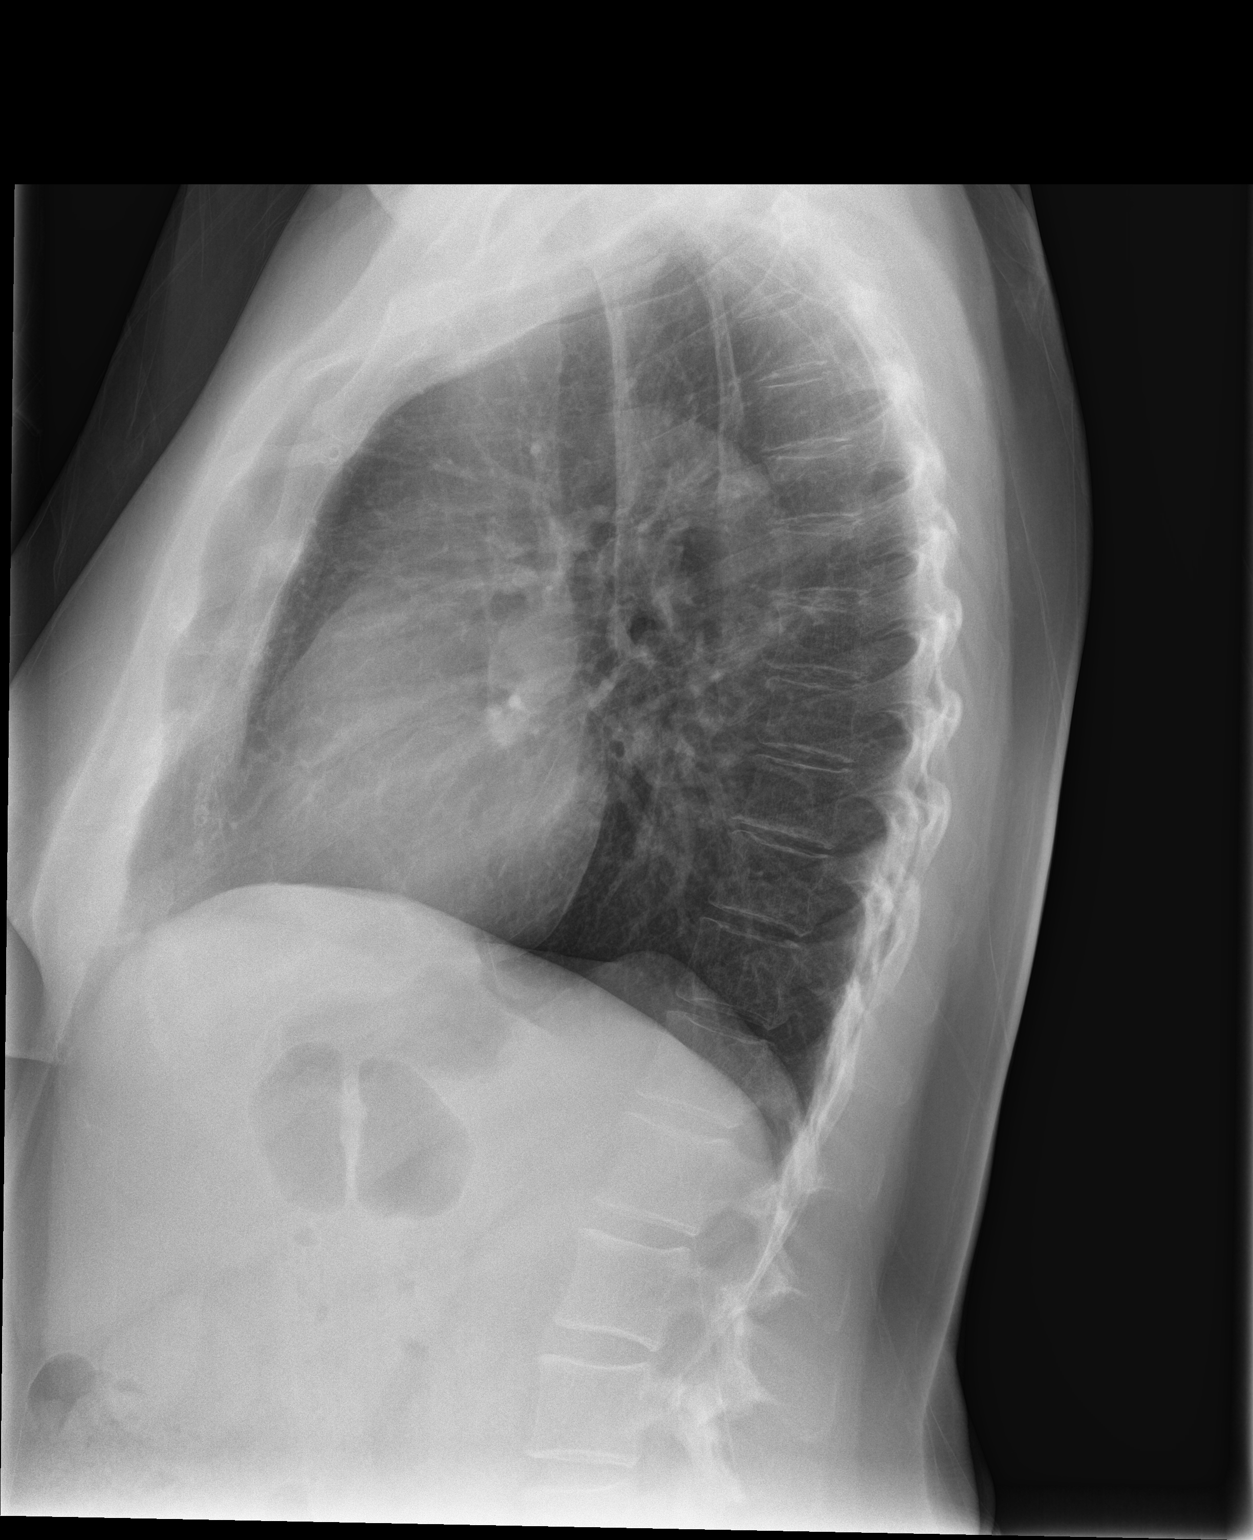

[2 of 2 positions shown; findings below may reference images not displayed]

FINDINGS: The heart size and mediastinal contours are within normal limits.
Both lungs are clear. The visualized skeletal structures are
unremarkable.
IMPRESSION: No active cardiopulmonary disease.

## 2021-08-02 DIAGNOSIS — D485 Neoplasm of uncertain behavior of skin: Secondary | ICD-10-CM | POA: Diagnosis not present

## 2021-08-02 DIAGNOSIS — L570 Actinic keratosis: Secondary | ICD-10-CM | POA: Diagnosis not present

## 2021-08-02 DIAGNOSIS — D2362 Other benign neoplasm of skin of left upper limb, including shoulder: Secondary | ICD-10-CM | POA: Diagnosis not present

## 2021-09-27 DIAGNOSIS — D0359 Melanoma in situ of other part of trunk: Secondary | ICD-10-CM | POA: Diagnosis not present

## 2021-09-27 DIAGNOSIS — L905 Scar conditions and fibrosis of skin: Secondary | ICD-10-CM | POA: Diagnosis not present

## 2021-12-18 DIAGNOSIS — D225 Melanocytic nevi of trunk: Secondary | ICD-10-CM | POA: Diagnosis not present

## 2021-12-18 DIAGNOSIS — D485 Neoplasm of uncertain behavior of skin: Secondary | ICD-10-CM | POA: Diagnosis not present

## 2022-02-04 DIAGNOSIS — D2361 Other benign neoplasm of skin of right upper limb, including shoulder: Secondary | ICD-10-CM | POA: Diagnosis not present

## 2022-02-04 DIAGNOSIS — Z08 Encounter for follow-up examination after completed treatment for malignant neoplasm: Secondary | ICD-10-CM | POA: Diagnosis not present

## 2022-02-04 DIAGNOSIS — L570 Actinic keratosis: Secondary | ICD-10-CM | POA: Diagnosis not present

## 2022-02-04 DIAGNOSIS — Z86006 Personal history of melanoma in-situ: Secondary | ICD-10-CM | POA: Diagnosis not present

## 2022-11-25 DIAGNOSIS — B078 Other viral warts: Secondary | ICD-10-CM | POA: Diagnosis not present

## 2022-11-25 DIAGNOSIS — D225 Melanocytic nevi of trunk: Secondary | ICD-10-CM | POA: Diagnosis not present

## 2022-11-25 DIAGNOSIS — D2271 Melanocytic nevi of right lower limb, including hip: Secondary | ICD-10-CM | POA: Diagnosis not present

## 2022-11-25 DIAGNOSIS — L821 Other seborrheic keratosis: Secondary | ICD-10-CM | POA: Diagnosis not present

## 2022-11-25 DIAGNOSIS — D2262 Melanocytic nevi of left upper limb, including shoulder: Secondary | ICD-10-CM | POA: Diagnosis not present

## 2022-11-25 DIAGNOSIS — D2272 Melanocytic nevi of left lower limb, including hip: Secondary | ICD-10-CM | POA: Diagnosis not present

## 2022-11-25 DIAGNOSIS — D2261 Melanocytic nevi of right upper limb, including shoulder: Secondary | ICD-10-CM | POA: Diagnosis not present

## 2022-11-25 DIAGNOSIS — L538 Other specified erythematous conditions: Secondary | ICD-10-CM | POA: Diagnosis not present

## 2023-07-02 DIAGNOSIS — R42 Dizziness and giddiness: Secondary | ICD-10-CM | POA: Diagnosis not present

## 2023-07-02 DIAGNOSIS — H903 Sensorineural hearing loss, bilateral: Secondary | ICD-10-CM | POA: Diagnosis not present

## 2023-07-02 DIAGNOSIS — H6063 Unspecified chronic otitis externa, bilateral: Secondary | ICD-10-CM | POA: Diagnosis not present

## 2023-07-02 DIAGNOSIS — H6983 Other specified disorders of Eustachian tube, bilateral: Secondary | ICD-10-CM | POA: Diagnosis not present

## 2023-07-23 DIAGNOSIS — H903 Sensorineural hearing loss, bilateral: Secondary | ICD-10-CM | POA: Diagnosis not present

## 2023-07-23 DIAGNOSIS — R42 Dizziness and giddiness: Secondary | ICD-10-CM | POA: Diagnosis not present

## 2023-07-23 DIAGNOSIS — H6983 Other specified disorders of Eustachian tube, bilateral: Secondary | ICD-10-CM | POA: Diagnosis not present
